# Patient Record
Sex: Female | Born: 1960 | Race: White | Hispanic: No | Marital: Single | Smoking: Never smoker
Health system: Southern US, Community
[De-identification: ages and names within clinical notes are randomized; demographics above are authoritative.]

## PROBLEM LIST (undated history)

## (undated) DIAGNOSIS — Z923 Personal history of irradiation: Secondary | ICD-10-CM

## (undated) DIAGNOSIS — Z809 Family history of malignant neoplasm, unspecified: Secondary | ICD-10-CM

## (undated) DIAGNOSIS — Z87442 Personal history of urinary calculi: Secondary | ICD-10-CM

## (undated) DIAGNOSIS — T7840XA Allergy, unspecified, initial encounter: Secondary | ICD-10-CM

## (undated) DIAGNOSIS — C801 Malignant (primary) neoplasm, unspecified: Secondary | ICD-10-CM

## (undated) DIAGNOSIS — J302 Other seasonal allergic rhinitis: Secondary | ICD-10-CM

## (undated) DIAGNOSIS — J42 Unspecified chronic bronchitis: Secondary | ICD-10-CM

## (undated) DIAGNOSIS — N63 Unspecified lump in unspecified breast: Secondary | ICD-10-CM

## (undated) DIAGNOSIS — Z1379 Encounter for other screening for genetic and chromosomal anomalies: Principal | ICD-10-CM

## (undated) DIAGNOSIS — N390 Urinary tract infection, site not specified: Secondary | ICD-10-CM

## (undated) DIAGNOSIS — Z803 Family history of malignant neoplasm of breast: Secondary | ICD-10-CM

## (undated) DIAGNOSIS — K5790 Diverticulosis of intestine, part unspecified, without perforation or abscess without bleeding: Secondary | ICD-10-CM

## (undated) DIAGNOSIS — K219 Gastro-esophageal reflux disease without esophagitis: Secondary | ICD-10-CM

## (undated) DIAGNOSIS — J45909 Unspecified asthma, uncomplicated: Secondary | ICD-10-CM

## (undated) DIAGNOSIS — B029 Zoster without complications: Secondary | ICD-10-CM

## (undated) DIAGNOSIS — S92919A Unspecified fracture of unspecified toe(s), initial encounter for closed fracture: Secondary | ICD-10-CM

## (undated) HISTORY — PX: COLONOSCOPY: SHX174

## (undated) HISTORY — DX: Unspecified lump in unspecified breast: N63.0

## (undated) HISTORY — DX: Gastro-esophageal reflux disease without esophagitis: K21.9

## (undated) HISTORY — DX: Zoster without complications: B02.9

## (undated) HISTORY — DX: Unspecified fracture of unspecified toe(s), initial encounter for closed fracture: S92.919A

## (undated) HISTORY — DX: Family history of malignant neoplasm, unspecified: Z80.9

## (undated) HISTORY — DX: Unspecified chronic bronchitis: J42

## (undated) HISTORY — DX: Urinary tract infection, site not specified: N39.0

## (undated) HISTORY — PX: OTHER SURGICAL HISTORY: SHX169

## (undated) HISTORY — DX: Diverticulosis of intestine, part unspecified, without perforation or abscess without bleeding: K57.90

## (undated) HISTORY — DX: Unspecified asthma, uncomplicated: J45.909

## (undated) HISTORY — DX: Encounter for other screening for genetic and chromosomal anomalies: Z13.79

## (undated) HISTORY — DX: Allergy, unspecified, initial encounter: T78.40XA

## (undated) HISTORY — DX: Family history of malignant neoplasm of breast: Z80.3

---

## 1989-07-05 DIAGNOSIS — S92919A Unspecified fracture of unspecified toe(s), initial encounter for closed fracture: Secondary | ICD-10-CM

## 1989-07-05 HISTORY — DX: Unspecified fracture of unspecified toe(s), initial encounter for closed fracture: S92.919A

## 2002-01-02 DIAGNOSIS — N63 Unspecified lump in unspecified breast: Secondary | ICD-10-CM

## 2002-01-02 HISTORY — DX: Unspecified lump in unspecified breast: N63.0

## 2002-01-12 ENCOUNTER — Encounter: Payer: Self-pay | Admitting: Internal Medicine

## 2002-01-12 ENCOUNTER — Encounter: Admission: RE | Admit: 2002-01-12 | Discharge: 2002-01-12 | Payer: Self-pay | Admitting: Internal Medicine

## 2004-01-17 ENCOUNTER — Encounter: Admission: RE | Admit: 2004-01-17 | Discharge: 2004-01-17 | Payer: Self-pay | Admitting: Internal Medicine

## 2005-01-17 ENCOUNTER — Encounter: Admission: RE | Admit: 2005-01-17 | Discharge: 2005-01-17 | Payer: Self-pay | Admitting: Internal Medicine

## 2005-03-12 ENCOUNTER — Ambulatory Visit (HOSPITAL_COMMUNITY): Admission: RE | Admit: 2005-03-12 | Discharge: 2005-03-12 | Payer: Self-pay | Admitting: Internal Medicine

## 2006-01-20 ENCOUNTER — Encounter: Admission: RE | Admit: 2006-01-20 | Discharge: 2006-01-20 | Payer: Self-pay | Admitting: Internal Medicine

## 2006-09-11 ENCOUNTER — Encounter: Admission: RE | Admit: 2006-09-11 | Discharge: 2006-09-11 | Payer: Self-pay | Admitting: Internal Medicine

## 2006-10-04 DIAGNOSIS — B029 Zoster without complications: Secondary | ICD-10-CM

## 2006-10-04 HISTORY — DX: Zoster without complications: B02.9

## 2007-01-23 ENCOUNTER — Encounter: Admission: RE | Admit: 2007-01-23 | Discharge: 2007-01-23 | Payer: Self-pay | Admitting: Internal Medicine

## 2007-04-14 ENCOUNTER — Ambulatory Visit: Payer: Self-pay | Admitting: Internal Medicine

## 2007-04-23 ENCOUNTER — Ambulatory Visit: Payer: Self-pay | Admitting: Internal Medicine

## 2008-01-28 ENCOUNTER — Encounter: Admission: RE | Admit: 2008-01-28 | Discharge: 2008-01-28 | Payer: Self-pay | Admitting: Internal Medicine

## 2009-01-31 ENCOUNTER — Encounter: Admission: RE | Admit: 2009-01-31 | Discharge: 2009-01-31 | Payer: Self-pay | Admitting: Internal Medicine

## 2010-02-06 ENCOUNTER — Encounter: Admission: RE | Admit: 2010-02-06 | Discharge: 2010-02-06 | Payer: Self-pay | Admitting: Internal Medicine

## 2011-01-09 ENCOUNTER — Other Ambulatory Visit: Payer: Self-pay | Admitting: Internal Medicine

## 2011-01-09 DIAGNOSIS — Z1231 Encounter for screening mammogram for malignant neoplasm of breast: Secondary | ICD-10-CM

## 2011-02-12 ENCOUNTER — Ambulatory Visit
Admission: RE | Admit: 2011-02-12 | Discharge: 2011-02-12 | Disposition: A | Discharge status: Home / Self Care | Payer: BLUE CROSS/BLUE SHIELD | Source: Ambulatory Visit | Attending: Internal Medicine | Admitting: Internal Medicine

## 2011-02-12 DIAGNOSIS — Z1231 Encounter for screening mammogram for malignant neoplasm of breast: Secondary | ICD-10-CM

## 2012-01-16 ENCOUNTER — Other Ambulatory Visit: Payer: Self-pay | Admitting: Internal Medicine

## 2012-01-16 DIAGNOSIS — Z1231 Encounter for screening mammogram for malignant neoplasm of breast: Secondary | ICD-10-CM

## 2012-02-19 ENCOUNTER — Ambulatory Visit
Admission: RE | Admit: 2012-02-19 | Discharge: 2012-02-19 | Disposition: A | Discharge status: Home / Self Care | Payer: BC Managed Care – PPO | Source: Ambulatory Visit | Attending: Internal Medicine | Admitting: Internal Medicine

## 2012-02-19 DIAGNOSIS — Z1231 Encounter for screening mammogram for malignant neoplasm of breast: Secondary | ICD-10-CM

## 2012-04-29 ENCOUNTER — Encounter: Payer: Self-pay | Admitting: Internal Medicine

## 2012-05-22 ENCOUNTER — Encounter: Payer: Self-pay | Admitting: *Deleted

## 2012-05-25 ENCOUNTER — Encounter: Payer: Self-pay | Admitting: Internal Medicine

## 2012-05-25 ENCOUNTER — Ambulatory Visit (INDEPENDENT_AMBULATORY_CARE_PROVIDER_SITE_OTHER): Payer: BC Managed Care – PPO | Admitting: Internal Medicine

## 2012-05-25 VITALS — BP 100/70 | HR 84 | Ht 62.0 in | Wt 131.2 lb

## 2012-05-25 DIAGNOSIS — R195 Other fecal abnormalities: Secondary | ICD-10-CM

## 2012-05-25 DIAGNOSIS — J45909 Unspecified asthma, uncomplicated: Secondary | ICD-10-CM | POA: Insufficient documentation

## 2012-05-25 DIAGNOSIS — J42 Unspecified chronic bronchitis: Secondary | ICD-10-CM | POA: Insufficient documentation

## 2012-05-25 MED ORDER — MOVIPREP 100 G PO SOLR: ORAL | Status: DC

## 2012-05-25 NOTE — Progress Notes (Signed)
  Subjective:  Referred by: Gaspar Garbe, MD   Patient ID: Mallory Barnes, female    DOB: 1961-07-12, 51 y.o.   MRN: [id number]  HPI This is a very pleasant 51 year old single white woman who was found to have an immune based fecal Hemoccult positive stool on recent routine physical with labs. She is not anemic. She has no active symptoms and has not seen blood. She takes proton next because of cough issues which  improved but eventually resolved or significantly improved using Advair on a regular basis.  Medications, allergies, past medical history, past surgical history, family history and social history are reviewed and updated in the EMR.  Review of Systems This is positive for  allergies and night sweats. Recent UTI troubles.    Objective:   Physical Exam General:  NAD Eyes:   anicteric Lungs:  clear Heart:  S1S2 no rubs, murmurs or gallops Abdomen:  soft and nontender, BS+ Ext:   no edema    Data Reviewed:   Hemoglobin 12.5 white blood cell count 6.3 on 04/10/2012. No gross 112 BUN 25 otherwise normal complete metabolic panel. She had pyuria and hematuria on urinalysis that day.       Assessment & Plan:   1. Heme positive stool (iFOBT)    will schedule for colonoscopy to look for cause of heme positive stool. Previous colonoscopy June 2008 revealed diverticulosis. She does have a grandparent and a maternal uncle with colon cancer. This may not increase her risk, timing of next routine colonoscopy to be determined pending the results of the upcoming procedure.The risks and benefits as well as alternatives of endoscopic procedure(s) have been discussed and reviewed. All questions answered. The patient agrees to proceed.  Was advised to bring her albuterol inhaler for her procedure visit.   I appreciate the opportunity to care for this patient.  CC: Gaspar Garbe, MD

## 2012-05-25 NOTE — Patient Instructions (Addendum)
You have been scheduled for a colonoscopy with propofol. Please follow written instructions given to you at your visit today.  Please pick up your prep kit at the pharmacy within the next 1-3 days. If you use inhalers (even only as needed), please bring them with you on the day of your procedure.   Thank you for choosing me and [facility]. 

## 2012-06-11 ENCOUNTER — Encounter: Payer: BC Managed Care – PPO | Admitting: Internal Medicine

## 2012-07-08 ENCOUNTER — Ambulatory Visit (AMBULATORY_SURGERY_CENTER): Payer: BC Managed Care – PPO | Admitting: Internal Medicine

## 2012-07-08 ENCOUNTER — Encounter: Payer: Self-pay | Admitting: Internal Medicine

## 2012-07-08 VITALS — BP 107/58 | HR 92 | Temp 98.0°F | Resp 20 | Ht 62.0 in | Wt 131.0 lb

## 2012-07-08 DIAGNOSIS — K5730 Diverticulosis of large intestine without perforation or abscess without bleeding: Secondary | ICD-10-CM

## 2012-07-08 DIAGNOSIS — R195 Other fecal abnormalities: Secondary | ICD-10-CM

## 2012-07-08 MED ORDER — SODIUM CHLORIDE 0.9 % IV SOLN: 500.0000 mL | INTRAVENOUS | Status: DC

## 2012-07-08 NOTE — Patient Instructions (Addendum)
Slight diverticulosis was found. No polyps or cancer. The blood in the stool may have come from ano-rectal irritation that could have been present.  I do not think the family history of colon cancer (grandparent and aunt) increases your risk of colon cancer based upon review of current expert opinion.  I recommend a routine repeat colonoscopy in 10 years and do not think routine testing of the stool for blood is needed. If that was done I would wait until 5 years has passed 04-08-2018).  Thank you for choosing me and [facility].  Iva Boop, MD, FACG   YOU HAD AN ENDOSCOPIC PROCEDURE TODAY AT THE [facility]: Refer to the procedure report that was given to you for any specific questions about what was found during the examination.  If the procedure report does not answer your questions, please call your gastroenterologist to clarify.  If you requested that your care partner not be given the details of your procedure findings, then the procedure report has been included in a sealed envelope for you to review at your convenience later.  YOU SHOULD EXPECT: Some feelings of bloating in the abdomen. Passage of more gas than usual.  Walking can help get rid of the air that was put into your GI tract during the procedure and reduce the bloating. If you had a lower endoscopy (such as a colonoscopy or flexible sigmoidoscopy) you may notice spotting of blood in your stool or on the toilet paper. If you underwent a bowel prep for your procedure, then you may not have a normal bowel movement for a few days.  DIET: Your first meal following the procedure should be a light meal and then it is ok to progress to your normal diet.  A half-sandwich or bowl of soup is an example of a good first meal.  Heavy or fried foods are harder to digest and may make you feel nauseous or bloated.  Likewise meals heavy in dairy and vegetables can cause extra gas to form and this can also increase the  bloating.  Drink plenty of fluids but you should avoid alcoholic beverages for 24 hours.  ACTIVITY: Your care partner should take you home directly after the procedure.  You should plan to take it easy, moving slowly for the rest of the day.  You can resume normal activity the day after the procedure however you should NOT DRIVE or use heavy machinery for 24 hours (because of the sedation medicines used during the test).    SYMPTOMS TO REPORT IMMEDIATELY: A gastroenterologist can be reached at any hour.  During normal business hours, 8:30 AM to 5:00 PM Monday through Friday, call [phone].  After hours and on weekends, please call the GI answering service at [phone] who will take a message and have the physician on call contact you.   Following lower endoscopy (colonoscopy or flexible sigmoidoscopy):  Excessive amounts of blood in the stool  Significant tenderness or worsening of abdominal pains  Swelling of the abdomen that is new, acute  Fever of 100F or higher  Following upper endoscopy (EGD)  Vomiting of blood or coffee ground material  New chest pain or pain under the shoulder blades  Painful or persistently difficult swallowing  New shortness of breath  Fever of 100F or higher  Black, tarry-looking stools  FOLLOW UP: If any biopsies were taken you will be contacted by phone or by letter within the next 1-3 weeks.  Call your gastroenterologist if  you have not heard about the biopsies in 3 weeks.  Our staff will call the home number listed on your records the next business day following your procedure to check on you and address any questions or concerns that you may have at that time regarding the information given to you following your procedure. This is a courtesy call and so if there is no answer at the home number and we have not heard from you through the emergency physician on call, we will assume that you have returned to your regular daily activities without  incident.  SIGNATURES/CONFIDENTIALITY: You and/or your care partner have signed paperwork which will be entered into your electronic medical record.  These signatures attest to the fact that that the information above on your After Visit Summary has been reviewed and is understood.  Full responsibility of the confidentiality of this discharge information lies with you and/or your care-partner.   Diverticulosis and high fiber diet information given.

## 2012-07-08 NOTE — Progress Notes (Signed)
Patient did not experience any of the following events: a burn prior to discharge; a fall within the facility; wrong site/side/patient/procedure/implant event; or a hospital transfer or hospital admission upon discharge from the facility. (G8907) Patient did not have preoperative order for IV antibiotic SSI prophylaxis. (G8918)  

## 2012-07-08 NOTE — Op Note (Signed)
[facility] [address] [place], [address]   COLONOSCOPY PROCEDURE REPORT  PATIENT: Mallory Barnes, Mallory Barnes  MR#: [id number] BIRTHDATE: 06/13/1961 , 50  yrs. old GENDER: Female ENDOSCOPIST: Iva Boop, MD, [facility] REFERRED NW:GNFAOZH, Richard PROCEDURE DATE:  07/08/2012 PROCEDURE:   Colonoscopy, diagnostic ASA CLASS:   Class II INDICATIONS:heme-positive stool.   iFOBT+ MEDICATIONS: propofol (Diprivan) 200mg  IV, MAC sedation, administered by CRNA, and These medications were titrated to patient response per physician's verbal order  DESCRIPTION OF PROCEDURE:   After the risks benefits and alternatives of the procedure were thoroughly explained, informed consent was obtained.  A digital rectal exam revealed no abnormalities of the rectum.   The LB [id number]  endoscope was introduced through the anus and advanced to the cecum, which was identified by both the appendix and ileocecal valve. No adverse events experienced.   The quality of the prep was Moviprep excellent  The instrument was then slowly withdrawn as the colon was fully examined.      COLON FINDINGS: Mild diverticulosis was noted in the sigmoid colon. The colon mucosa was otherwise normal.  Retroflexed views revealed no abnormalities. The time to cecum=2 minutes 30 seconds. Withdrawal time=8 minutes 05 seconds.  The scope was withdrawn and the procedure completed. COMPLICATIONS: There were no complications.  ENDOSCOPIC IMPRESSION: 1.   Mild diverticulosis was noted in the sigmoid colon 2.   The colon mucosa was otherwise normal no cause of heme + stool seen today  RECOMMENDATIONS: 1) repeat Colonscopy in 10 years - routine screening - has second degree-relatives with colon cancer but that does not elevate risk 2) do not recommend annual hemoocults - if done wait until 2019 to start   eSigned:  Iva Boop, MD, [facility] 07/08/2012 3:01 PM   cc: Guerry Bruin, MD and The  Patient

## 2012-07-09 ENCOUNTER — Telehealth: Payer: Self-pay | Admitting: *Deleted

## 2012-07-09 NOTE — Telephone Encounter (Signed)
  Follow up Call-  Call back number 07/08/2012  Post procedure Call Back phone  # [phone]  Permission to leave phone message Yes     Patient questions:  Do you have a fever, pain , or abdominal swelling? no Pain Score  0 *  Have you tolerated food without any problems? yes  Have you been able to return to your normal activities? yes  Do you have any questions about your discharge instructions: Diet   no Medications  no Follow up visit  no  Do you have questions or concerns about your Care? no  Actions: * If pain score is 4 or above: No action needed, pain <4.

## 2015-01-12 ENCOUNTER — Other Ambulatory Visit: Payer: Self-pay | Admitting: Internal Medicine

## 2015-01-12 DIAGNOSIS — Z1231 Encounter for screening mammogram for malignant neoplasm of breast: Secondary | ICD-10-CM

## 2015-02-28 ENCOUNTER — Ambulatory Visit
Admission: RE | Admit: 2015-02-28 | Discharge: 2015-02-28 | Disposition: A | Discharge status: Home / Self Care | Payer: 59 | Source: Ambulatory Visit | Attending: Internal Medicine | Admitting: Internal Medicine

## 2015-02-28 DIAGNOSIS — Z1231 Encounter for screening mammogram for malignant neoplasm of breast: Secondary | ICD-10-CM

## 2016-02-10 DIAGNOSIS — W1809XA Striking against other object with subsequent fall, initial encounter: Secondary | ICD-10-CM | POA: Diagnosis not present

## 2016-02-10 DIAGNOSIS — S81811A Laceration without foreign body, right lower leg, initial encounter: Secondary | ICD-10-CM | POA: Diagnosis not present

## 2016-02-10 DIAGNOSIS — S81812A Laceration without foreign body, left lower leg, initial encounter: Secondary | ICD-10-CM | POA: Diagnosis not present

## 2016-02-11 DIAGNOSIS — S81811D Laceration without foreign body, right lower leg, subsequent encounter: Secondary | ICD-10-CM | POA: Diagnosis not present

## 2016-02-11 DIAGNOSIS — Z79899 Other long term (current) drug therapy: Secondary | ICD-10-CM | POA: Diagnosis not present

## 2016-02-11 DIAGNOSIS — X58XXXD Exposure to other specified factors, subsequent encounter: Secondary | ICD-10-CM | POA: Diagnosis not present

## 2016-02-11 DIAGNOSIS — Z880 Allergy status to penicillin: Secondary | ICD-10-CM | POA: Diagnosis not present

## 2016-02-12 DIAGNOSIS — S81812A Laceration without foreign body, left lower leg, initial encounter: Secondary | ICD-10-CM | POA: Diagnosis not present

## 2016-02-12 DIAGNOSIS — Z6826 Body mass index (BMI) 26.0-26.9, adult: Secondary | ICD-10-CM | POA: Diagnosis not present

## 2016-02-12 DIAGNOSIS — Z5189 Encounter for other specified aftercare: Secondary | ICD-10-CM | POA: Diagnosis not present

## 2016-02-19 DIAGNOSIS — Z0000 Encounter for general adult medical examination without abnormal findings: Secondary | ICD-10-CM | POA: Diagnosis not present

## 2016-02-19 DIAGNOSIS — N390 Urinary tract infection, site not specified: Secondary | ICD-10-CM | POA: Diagnosis not present

## 2016-02-19 DIAGNOSIS — R7301 Impaired fasting glucose: Secondary | ICD-10-CM | POA: Diagnosis not present

## 2016-02-19 DIAGNOSIS — R8299 Other abnormal findings in urine: Secondary | ICD-10-CM | POA: Diagnosis not present

## 2016-02-23 DIAGNOSIS — Z6827 Body mass index (BMI) 27.0-27.9, adult: Secondary | ICD-10-CM | POA: Diagnosis not present

## 2016-02-23 DIAGNOSIS — S81812D Laceration without foreign body, left lower leg, subsequent encounter: Secondary | ICD-10-CM | POA: Diagnosis not present

## 2016-02-23 DIAGNOSIS — Z5189 Encounter for other specified aftercare: Secondary | ICD-10-CM | POA: Diagnosis not present

## 2016-02-26 DIAGNOSIS — R7301 Impaired fasting glucose: Secondary | ICD-10-CM | POA: Diagnosis not present

## 2016-02-26 DIAGNOSIS — N390 Urinary tract infection, site not specified: Secondary | ICD-10-CM | POA: Diagnosis not present

## 2016-02-26 DIAGNOSIS — Z0000 Encounter for general adult medical examination without abnormal findings: Secondary | ICD-10-CM | POA: Diagnosis not present

## 2016-02-26 DIAGNOSIS — Z124 Encounter for screening for malignant neoplasm of cervix: Secondary | ICD-10-CM | POA: Diagnosis not present

## 2016-02-26 DIAGNOSIS — Z6827 Body mass index (BMI) 27.0-27.9, adult: Secondary | ICD-10-CM | POA: Diagnosis not present

## 2016-02-26 DIAGNOSIS — K219 Gastro-esophageal reflux disease without esophagitis: Secondary | ICD-10-CM | POA: Diagnosis not present

## 2016-02-26 DIAGNOSIS — Z1389 Encounter for screening for other disorder: Secondary | ICD-10-CM | POA: Diagnosis not present

## 2017-02-18 ENCOUNTER — Other Ambulatory Visit: Payer: Self-pay | Admitting: Internal Medicine

## 2017-02-18 DIAGNOSIS — Z1231 Encounter for screening mammogram for malignant neoplasm of breast: Secondary | ICD-10-CM

## 2017-02-19 DIAGNOSIS — N390 Urinary tract infection, site not specified: Secondary | ICD-10-CM | POA: Diagnosis not present

## 2017-02-19 DIAGNOSIS — Z0000 Encounter for general adult medical examination without abnormal findings: Secondary | ICD-10-CM | POA: Diagnosis not present

## 2017-02-19 DIAGNOSIS — R7301 Impaired fasting glucose: Secondary | ICD-10-CM | POA: Diagnosis not present

## 2017-02-26 DIAGNOSIS — R7301 Impaired fasting glucose: Secondary | ICD-10-CM | POA: Diagnosis not present

## 2017-02-26 DIAGNOSIS — Z0000 Encounter for general adult medical examination without abnormal findings: Secondary | ICD-10-CM | POA: Diagnosis not present

## 2017-02-26 DIAGNOSIS — K219 Gastro-esophageal reflux disease without esophagitis: Secondary | ICD-10-CM | POA: Diagnosis not present

## 2017-02-26 DIAGNOSIS — J42 Unspecified chronic bronchitis: Secondary | ICD-10-CM | POA: Diagnosis not present

## 2017-02-26 DIAGNOSIS — N390 Urinary tract infection, site not specified: Secondary | ICD-10-CM | POA: Diagnosis not present

## 2017-02-26 DIAGNOSIS — Z1389 Encounter for screening for other disorder: Secondary | ICD-10-CM | POA: Diagnosis not present

## 2017-03-10 ENCOUNTER — Ambulatory Visit
Admission: RE | Admit: 2017-03-10 | Discharge: 2017-03-10 | Disposition: A | Discharge status: Home / Self Care | Payer: BLUE CROSS/BLUE SHIELD | Source: Ambulatory Visit | Attending: Internal Medicine | Admitting: Internal Medicine

## 2017-03-10 DIAGNOSIS — Z1231 Encounter for screening mammogram for malignant neoplasm of breast: Secondary | ICD-10-CM | POA: Diagnosis not present

## 2017-04-28 DIAGNOSIS — H524 Presbyopia: Secondary | ICD-10-CM | POA: Diagnosis not present

## 2017-04-28 DIAGNOSIS — H2513 Age-related nuclear cataract, bilateral: Secondary | ICD-10-CM | POA: Diagnosis not present

## 2017-04-28 DIAGNOSIS — H52203 Unspecified astigmatism, bilateral: Secondary | ICD-10-CM | POA: Diagnosis not present

## 2018-02-18 ENCOUNTER — Other Ambulatory Visit: Payer: Self-pay | Admitting: Internal Medicine

## 2018-02-18 DIAGNOSIS — Z1231 Encounter for screening mammogram for malignant neoplasm of breast: Secondary | ICD-10-CM

## 2018-02-23 DIAGNOSIS — R7301 Impaired fasting glucose: Secondary | ICD-10-CM | POA: Diagnosis not present

## 2018-02-23 DIAGNOSIS — Z0000 Encounter for general adult medical examination without abnormal findings: Secondary | ICD-10-CM | POA: Diagnosis not present

## 2018-03-02 DIAGNOSIS — Z124 Encounter for screening for malignant neoplasm of cervix: Secondary | ICD-10-CM | POA: Diagnosis not present

## 2018-03-02 DIAGNOSIS — N390 Urinary tract infection, site not specified: Secondary | ICD-10-CM | POA: Diagnosis not present

## 2018-03-02 DIAGNOSIS — R7301 Impaired fasting glucose: Secondary | ICD-10-CM | POA: Diagnosis not present

## 2018-03-02 DIAGNOSIS — J42 Unspecified chronic bronchitis: Secondary | ICD-10-CM | POA: Diagnosis not present

## 2018-03-02 DIAGNOSIS — Z0000 Encounter for general adult medical examination without abnormal findings: Secondary | ICD-10-CM | POA: Diagnosis not present

## 2018-03-02 DIAGNOSIS — K219 Gastro-esophageal reflux disease without esophagitis: Secondary | ICD-10-CM | POA: Diagnosis not present

## 2018-03-02 DIAGNOSIS — Z1389 Encounter for screening for other disorder: Secondary | ICD-10-CM | POA: Diagnosis not present

## 2018-03-10 DIAGNOSIS — Z23 Encounter for immunization: Secondary | ICD-10-CM | POA: Diagnosis not present

## 2018-03-10 DIAGNOSIS — W540XXA Bitten by dog, initial encounter: Secondary | ICD-10-CM | POA: Diagnosis not present

## 2018-03-10 DIAGNOSIS — S80872A Other superficial bite, left lower leg, initial encounter: Secondary | ICD-10-CM | POA: Diagnosis not present

## 2018-03-13 ENCOUNTER — Ambulatory Visit
Admission: RE | Admit: 2018-03-13 | Discharge: 2018-03-13 | Disposition: A | Discharge status: Home / Self Care | Payer: BLUE CROSS/BLUE SHIELD | Source: Ambulatory Visit | Attending: Internal Medicine | Admitting: Internal Medicine

## 2018-03-13 DIAGNOSIS — Z1231 Encounter for screening mammogram for malignant neoplasm of breast: Secondary | ICD-10-CM

## 2018-03-16 ENCOUNTER — Other Ambulatory Visit: Payer: Self-pay | Admitting: Internal Medicine

## 2018-03-16 DIAGNOSIS — R928 Other abnormal and inconclusive findings on diagnostic imaging of breast: Secondary | ICD-10-CM

## 2018-03-23 ENCOUNTER — Ambulatory Visit
Admission: RE | Admit: 2018-03-23 | Discharge: 2018-03-23 | Disposition: A | Discharge status: Home / Self Care | Payer: BLUE CROSS/BLUE SHIELD | Source: Ambulatory Visit | Attending: Internal Medicine | Admitting: Internal Medicine

## 2018-03-23 ENCOUNTER — Other Ambulatory Visit: Payer: Self-pay | Admitting: Internal Medicine

## 2018-03-23 DIAGNOSIS — R928 Other abnormal and inconclusive findings on diagnostic imaging of breast: Secondary | ICD-10-CM

## 2018-03-23 DIAGNOSIS — R921 Mammographic calcification found on diagnostic imaging of breast: Secondary | ICD-10-CM

## 2018-03-27 ENCOUNTER — Ambulatory Visit
Admission: RE | Admit: 2018-03-27 | Discharge: 2018-03-27 | Disposition: A | Discharge status: Home / Self Care | Payer: BLUE CROSS/BLUE SHIELD | Source: Ambulatory Visit | Attending: Internal Medicine | Admitting: Internal Medicine

## 2018-03-27 ENCOUNTER — Other Ambulatory Visit: Payer: Self-pay | Admitting: Internal Medicine

## 2018-03-27 DIAGNOSIS — R921 Mammographic calcification found on diagnostic imaging of breast: Secondary | ICD-10-CM

## 2018-03-27 DIAGNOSIS — D0511 Intraductal carcinoma in situ of right breast: Secondary | ICD-10-CM | POA: Diagnosis not present

## 2018-04-01 ENCOUNTER — Encounter: Payer: Self-pay | Admitting: *Deleted

## 2018-04-01 DIAGNOSIS — D0511 Intraductal carcinoma in situ of right breast: Secondary | ICD-10-CM

## 2018-04-08 ENCOUNTER — Ambulatory Visit: Payer: Self-pay | Admitting: Surgery

## 2018-04-08 ENCOUNTER — Ambulatory Visit
Admission: RE | Admit: 2018-04-08 | Discharge: 2018-04-08 | Disposition: A | Discharge status: Home / Self Care | Payer: BLUE CROSS/BLUE SHIELD | Source: Ambulatory Visit | Attending: Radiation Oncology | Admitting: Radiation Oncology

## 2018-04-08 ENCOUNTER — Ambulatory Visit: Payer: BLUE CROSS/BLUE SHIELD | Admitting: Physical Therapy

## 2018-04-08 ENCOUNTER — Encounter: Payer: Self-pay | Admitting: Hematology and Oncology

## 2018-04-08 ENCOUNTER — Inpatient Hospital Stay: Payer: BLUE CROSS/BLUE SHIELD | Attending: Hematology and Oncology | Admitting: Hematology and Oncology

## 2018-04-08 ENCOUNTER — Inpatient Hospital Stay: Payer: BLUE CROSS/BLUE SHIELD

## 2018-04-08 ENCOUNTER — Encounter: Payer: Self-pay | Admitting: Radiation Oncology

## 2018-04-08 DIAGNOSIS — Z8041 Family history of malignant neoplasm of ovary: Secondary | ICD-10-CM | POA: Insufficient documentation

## 2018-04-08 DIAGNOSIS — Z8042 Family history of malignant neoplasm of prostate: Secondary | ICD-10-CM | POA: Insufficient documentation

## 2018-04-08 DIAGNOSIS — Z800 Family history of malignant neoplasm of digestive organs: Secondary | ICD-10-CM | POA: Diagnosis not present

## 2018-04-08 DIAGNOSIS — Z807 Family history of other malignant neoplasms of lymphoid, hematopoietic and related tissues: Secondary | ICD-10-CM | POA: Diagnosis not present

## 2018-04-08 DIAGNOSIS — K219 Gastro-esophageal reflux disease without esophagitis: Secondary | ICD-10-CM | POA: Insufficient documentation

## 2018-04-08 DIAGNOSIS — Z803 Family history of malignant neoplasm of breast: Secondary | ICD-10-CM | POA: Insufficient documentation

## 2018-04-08 DIAGNOSIS — D0511 Intraductal carcinoma in situ of right breast: Secondary | ICD-10-CM

## 2018-04-08 DIAGNOSIS — Z87440 Personal history of urinary (tract) infections: Secondary | ICD-10-CM | POA: Insufficient documentation

## 2018-04-08 DIAGNOSIS — Z79899 Other long term (current) drug therapy: Secondary | ICD-10-CM | POA: Insufficient documentation

## 2018-04-08 DIAGNOSIS — Z171 Estrogen receptor negative status [ER-]: Secondary | ICD-10-CM | POA: Diagnosis not present

## 2018-04-08 LAB — CBC WITH DIFFERENTIAL (CANCER CENTER ONLY)
BASOS ABS: 0 10*3/uL (ref 0.0–0.1)
Basophils Relative: 1 %
Eosinophils Absolute: 0.1 10*3/uL (ref 0.0–0.5)
Eosinophils Relative: 2 %
HCT: 39.9 % (ref 34.8–46.6)
Hemoglobin: 13.3 g/dL (ref 11.6–15.9)
LYMPHS ABS: 1.5 10*3/uL (ref 0.9–3.3)
LYMPHS PCT: 30 %
MCH: 29.8 pg (ref 25.1–34.0)
MCHC: 33.4 g/dL (ref 31.5–36.0)
MCV: 89.3 fL (ref 79.5–101.0)
Monocytes Absolute: 0.3 10*3/uL (ref 0.1–0.9)
Monocytes Relative: 7 %
NEUTROS PCT: 60 %
Neutro Abs: 3 10*3/uL (ref 1.5–6.5)
Platelet Count: 254 10*3/uL (ref 145–400)
RBC: 4.47 MIL/uL (ref 3.70–5.45)
RDW: 13 % (ref 11.2–14.5)
WBC: 4.9 10*3/uL (ref 3.9–10.3)

## 2018-04-08 LAB — CMP (CANCER CENTER ONLY)
ALK PHOS: 86 U/L (ref 40–150)
ALT: 17 U/L (ref 0–55)
AST: 24 U/L (ref 5–34)
Albumin: 4.4 g/dL (ref 3.5–5.0)
Anion gap: 11 (ref 3–11)
BUN: 26 mg/dL (ref 7–26)
CALCIUM: 9.9 mg/dL (ref 8.4–10.4)
CO2: 26 mmol/L (ref 22–29)
Chloride: 104 mmol/L (ref 98–109)
Creatinine: 1.41 mg/dL — ABNORMAL HIGH (ref 0.60–1.10)
GFR, EST AFRICAN AMERICAN: 48 mL/min — AB (ref >60)
GFR, EST NON AFRICAN AMERICAN: 41 mL/min — AB (ref >60)
Glucose, Bld: 112 mg/dL (ref 70–140)
Potassium: 4.2 mmol/L (ref 3.5–5.1)
SODIUM: 141 mmol/L (ref 136–145)
Total Bilirubin: 0.5 mg/dL (ref 0.2–1.2)
Total Protein: 7 g/dL (ref 6.4–8.3)

## 2018-04-08 NOTE — Progress Notes (Signed)
Nutrition Assessment  Reason for Assessment:  Pt seen in Breast Clinic  ASSESSMENT:   57 year old female with new diagnosis of breast cancer.  Past medical history asthma, GERD.    Patient reports normal appetite but does not like a lot of vegetables.  Medications:  reviewed  Labs: reviewed  Anthropometrics:   Height: 62 inches Weight: 132 lb BMI: 24   NUTRITION DIAGNOSIS: Food and nutrition related knowledge deficit related to new diagnosis of breast cancer as evidenced by no prior need for nutrition related information.  INTERVENTION:   Discussed and provided packet of information regarding nutritional tips for breast cancer patients.  Questions answered.  Teachback method used.  Contact information provided and patient knows to contact me with questions/concerns.    MONITORING, EVALUATION, and GOAL: Pt will consume a healthy plant based diet to maintain lean body mass throughout treatment.   Arjan Strohm B. Zenia Resides, [place], [place] Registered Dietitian [phone] (pager)

## 2018-04-08 NOTE — Assessment & Plan Note (Signed)
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 It is considered a precancerous lesion. DCIS is classified as a 0. It is generally detected through mammograms as calcifications. We discussed the significance of grades and its impact on prognosis. We also discussed the importance of ER and PR receptors and their implications to adjuvant treatment options. Prognosis of DCIS dependence on grade, comedo necrosis. It is anticipated that if not treated, 20-30% of DCIS can develop into invasive breast cancer.  Recommendation: 1. Breast conserving surgery 2. Followed by adjuvant radiation therapy 3.  Genetics consultation because her sister had breast cancer at a young age   Return to clinic after surgery to discuss the final pathology report and come up with an adjuvant treatment plan.

## 2018-04-08 NOTE — Progress Notes (Signed)
Radiation Oncology         [phone] ________________________________  Initial Outpatient Consultation  Name: Mallory Barnes MRN: [id number]  Date: 04/08/2018  DOB: December 10, 1960  NW:GNFAOZH, Mallory Him, MD  Mallory Luna, MD   REFERRING PHYSICIAN: Erroll Luna, MD  DIAGNOSIS:    ICD-10-CM   1. Ductal carcinoma in situ (DCIS) of right breast D05.11    Stage 0 Right Breast UOQ Ductal Carcinoma In Situ, ER(-) / PR(-), High Grade  CHIEF COMPLAINT: Here to discuss management of right breast DCIS  HISTORY OF PRESENT ILLNESS::Mallory Barnes is a 57 y.o. female who presented with screening detected right breast calcifications on 03/13/18 mammography.  Diagnostic mammogram from 03/23/18 showed two adjacent indeterminate groups of coarse heterogeneous calcifications in the UOQ of the right breast.  The more anterior group was larger and contained more numerous calcifications, spanning up to 1.0 cm.  The posterior group was loosely scattered, spanning 0.9 cm.  Biopsy of both groups on 03/27/18 showed ductal carcinoma in situ with calcifications and necrosis with characteristics as described above in the diagnosis. The patient reports a family history including breast and ovarian cancer in her half-sister (father's side).  On review of systems, the patient is positive for headaches that she describes as throbbing. She wears glasses. She is positive for occasional slight tinnitus in her right ear. She is positive for anxiety since diagnosis. She is positive for occasional hot flashes.   Gynecologic History: -Age at first menstrual period: 13-14 -Are you still having periods?: No -Approximate date of last period: 2014 -Have you used hormone replacement?: No  Obstetric History: -How many children have you carried to term?: 0 -Are you pregnant now or trying to get pregnant?: No -Have you used birth control pills or hormone shots for contraception?: No  PREVIOUS RADIATION THERAPY: No  PAST  MEDICAL HISTORY:  has a past medical history of Allergy, Asthma, Breast lump (01/2002), Chronic bronchitis ([place]), Diverticulosis, Fractured toe (1990's), GERD (gastroesophageal reflux disease), Shingles (10/2006), and UTI (urinary tract infection).    PAST SURGICAL HISTORY: Past Surgical History:  Procedure Laterality Date  . COLONOSCOPY      FAMILY HISTORY: family history includes Alzheimer's disease in her mother; Breast cancer in her sister; Cancer in her father; Colon cancer in her maternal grandmother and maternal uncle; Diabetes in her father and sister; Emphysema in her sister; Heart attack in her mother; Hodgkin's lymphoma in her sister; Hypertension in her father; Ovarian cancer in her sister; Pneumonia in her mother; Prostate cancer in her father.  SOCIAL HISTORY:  reports that she has never smoked. She has never used smokeless tobacco. She reports that she drinks alcohol. She reports that she does not use drugs. 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  ALLERGIES: Doxycycline; Ketek [telithromycin]; and Penicillins  MEDICATIONS:  Current Outpatient Medications  Medication Sig Dispense Refill  . albuterol (PROVENTIL HFA;VENTOLIN HFA) 108 (90 BASE) MCG/ACT inhaler Inhale 2 puffs into the lungs. 2 puffs pre exercise prn    . BREO ELLIPTA 100-25 MCG/INH AEPB   11  . diphenhydrAMINE (BENADRYL) 25 MG tablet Take 25 mg by mouth every 6 (six) hours as needed.    Marland Kitchen GLUCOSAMINE-CHONDROITIN-MSM PO Take by mouth 2 (two) times daily.    Marland Kitchen ibuprofen (ADVIL,MOTRIN) 200 MG tablet Take 200 mg by mouth. 1 tab twice daily    . milk thistle 175 MG tablet Take 175 mg by mouth daily. Per patient, 1000 mg daily    . montelukast (SINGULAIR) 10 MG  tablet   2  . MULTIPLE VITAMIN PO Take by mouth.    Marland Kitchen OVER THE COUNTER MEDICATION Blach Cohosh 540 mg daily.    Marland Kitchen OVER THE COUNTER MEDICATION Afrin pump mist only as needed for nasal congestion    . pantoprazole (PROTONIX) 40 MG tablet Take 40 mg by mouth daily.      No current facility-administered medications for this encounter.     REVIEW OF SYSTEMS: A 10+ POINT REVIEW OF SYSTEMS WAS OBTAINED including neurology, dermatology, psychiatry, cardiac, respiratory, lymph, extremities, GI, GU, Musculoskeletal, constitutional, breasts, reproductive, HEENT.  All pertinent positives are noted in the HPI.  All others are negative.   PHYSICAL EXAM:  Vitals with BMI 04/08/2018  Height 5\' 2"   Weight 132 lbs 5 oz  BMI 42.35  Systolic 361  Diastolic 95  Pulse 88  Respirations 18   General: Alert and oriented, in no acute distress. HEENT: Head is normocephalic. Extraocular movements are intact. Oropharynx is clear. Neck: Neck is supple, no palpable cervical or supraclavicular lymphadenopathy. Heart: Regular in rate and rhythm with no murmurs, rubs, or gallops. Chest: Clear to auscultation bilaterally, with no rhonchi, wheezes, or rales. Abdomen: Soft, nontender, nondistended, with no rigidity or guarding. Extremities: No cyanosis or edema. Lymphatics: see Neck Exam Skin: No concerning lesions. Musculoskeletal: Symmetric strength and muscle tone throughout. Neurologic: Cranial nerves II through XII are grossly intact. No obvious focalities. Speech is fluent. Coordination is intact. Psychiatric: Judgment and insight are intact. Affect is appropriate. Breasts: Post-biopsy thickening in the right breast in the 8:00-9:00 position. Otherwise no palpable masses in the axillary regions or the left breast.    ECOG =0  0 - Asymptomatic (Fully active, able to carry on all predisease activities without restriction)  1 - Symptomatic but completely ambulatory (Restricted in physically strenuous activity but ambulatory and able to carry out work of a light or sedentary nature. For example, light housework, office work)  2 - Symptomatic, <50% in bed during the day (Ambulatory and capable of all self care but unable to carry out any work activities. Up and about more  than 50% of waking hours)  3 - Symptomatic, >50% in bed, but not bedbound (Capable of only limited self-care, confined to bed or chair 50% or more of waking hours)  4 - Bedbound (Completely disabled. Cannot carry on any self-care. Totally confined to bed or chair)  5 - Death   Eustace Pen MM, Creech RH, Tormey DC, et al. [id number]). "Toxicity and response criteria of the [facility]". [place] Oncol. 5 (6): 649-55   LABORATORY DATA:  Lab Results  Component Value Date   WBC 4.9 04/08/2018   HGB 13.3 04/08/2018   HCT 39.9 04/08/2018   MCV 89.3 04/08/2018   PLT 254 04/08/2018   CMP     Component Value Date/Time   NA 141 04/08/2018 1220   K 4.2 04/08/2018 1220   CL 104 04/08/2018 1220   CO2 26 04/08/2018 1220   GLUCOSE 112 04/08/2018 1220   BUN 26 04/08/2018 1220   CREATININE 1.41 (H) 04/08/2018 1220   CALCIUM 9.9 04/08/2018 1220   PROT 7.0 04/08/2018 1220   ALBUMIN 4.4 04/08/2018 1220   AST 24 04/08/2018 1220   ALT 17 04/08/2018 1220   ALKPHOS 86 04/08/2018 1220   BILITOT 0.5 04/08/2018 1220   GFRNONAA 41 (L) 04/08/2018 1220   GFRAA 48 (L) 04/08/2018 1220         RADIOGRAPHY: Mm Digital Diagnostic Unilat  R  Result Date: 03/23/2018 CLINICAL DATA:  Screening recall for right breast calcifications. EXAM: DIGITAL DIAGNOSTIC RIGHT MAMMOGRAM WITH CAD COMPARISON:  Previous exam(s). ACR Breast Density Category c: The breast tissue is heterogeneously dense, which may obscure small masses. FINDINGS: In the upper-outer quadrant of the right breast, there are 2 adjacent groups of coarse heterogeneous calcifications. The more anterior group is larger and contains more numerous calcifications spanning up to 1.0 cm and has some linearly distributed forms. The posterior group is loosely scattered spanning 0.9 cm. Mammographic images were processed with CAD. IMPRESSION: There are 2 adjacent indeterminate groups of coarse heterogeneous calcifications in the upper-outer  quadrant of the right breast. RECOMMENDATION: Stereotactic biopsy is recommended for the 2 groups of right breast calcifications. The procedure has been scheduled for 03/27/2018 at 11:30 a.m. I have discussed the findings and recommendations with the patient. Results were also provided in writing at the conclusion of the visit. If applicable, a reminder letter will be sent to the patient regarding the next appointment. BI-RADS CATEGORY  4: Suspicious. Electronically Signed   By: Ammie Ferrier M.D.   On: 03/23/2018 15:52   Mm 3d Screen Breast Bilateral  Result Date: 03/13/2018 CLINICAL DATA:  Screening. EXAM: DIGITAL SCREENING BILATERAL MAMMOGRAM WITH TOMO AND CAD COMPARISON:  Previous exam(s). ACR Breast Density Category c: The breast tissue is heterogeneously dense, which may obscure small masses. FINDINGS: In the right breast, calcifications warrant further evaluation with magnified views. In the left breast, no findings suspicious for malignancy. Images were processed with CAD. IMPRESSION: Further evaluation is suggested for calcifications in the right breast. RECOMMENDATION: Diagnostic mammogram of the right breast. (Code:[id number]) The patient will be contacted regarding the findings, and additional imaging will be scheduled. BI-RADS CATEGORY  0: Incomplete. Need additional imaging evaluation and/or prior mammograms for comparison. Electronically Signed   By: Lajean Manes M.D.   On: 03/13/2018 16:17   Mm Clip Placement Right  Result Date: 03/27/2018 CLINICAL DATA:  Status post stereotactic guided core needle biopsies of 10 mm and 9 mm groups of calcifications in the upper-outer quadrant of the right breast EXAM: DIAGNOSTIC RIGHT MAMMOGRAM POST STEREOTACTIC BIOPSY COMPARISON:  Previous exam(s). FINDINGS: Mammographic images were obtained following stereotactic guided biopsy of the recently demonstrated 10 mm and 9 mm groups of calcifications in the upper-outer quadrant of the right breast. These  demonstrate a coil shaped biopsy marker clip at the location of the biopsied 10 mm group of calcifications and an X shaped biopsy marker clip at the location of the biopsied 9 mm group of calcifications. IMPRESSION: Appropriate clip deployment following stereotactic guided core needle biopsies of the recently demonstrated 10 mm and 9 mm groups of calcifications in the upper-outer quadrant of the right breast. Final Assessment: Post Procedure Mammograms for Marker Placement Electronically Signed   By: Claudie Revering M.D.   On: 03/27/2018 13:06   Mm Rt Breast Bx W Loc Dev 1st Lesion Image Bx Spec Stereo Guide  Addendum Date: 04/01/2018   ADDENDUM REPORT: 03/31/2018 12:13 ADDENDUM: Pathology revealed HIGH GRADE DUCTAL CARCINOMA IN SITU WITH CALCIFICATIONS AND NECROSIS of the Right breast, both locations, upper outer quadrant, 10 mm group of calcifications and 9 mm group of calcifications. This was found to be concordant by Dr. Claudie Revering. Pathology results were discussed with the patient by telephone. The patient reported doing well after the biopsies with tenderness at the sites. Post biopsy instructions and care were reviewed and questions were answered. The patient was encouraged to  call [facility] for any additional concerns. The patient was referred to [facility] on April 08, 2018. Pathology results reported by Terie Purser, RN on 03/31/2018. Electronically Signed   By: Claudie Revering M.D.   On: 03/31/2018 12:13   Result Date: 04/01/2018 CLINICAL DATA:  10 mm and 9 mm groups of indeterminate calcifications in the upper-outer right breast at recent mammography. EXAM: RIGHT BREAST STEREOTACTIC CORE NEEDLE BIOPSY X 2 COMPARISON:  Previous exams. FINDINGS: The patient and I discussed the procedure of stereotactic-guided biopsy including benefits and alternatives. We discussed the high likelihood of a successful  procedure. We discussed the risks of the procedure including infection, bleeding, tissue injury, clip migration, and inadequate sampling. Informed written consent was given. The usual time out protocol was performed immediately prior to the procedure. SITE 1: 10 MM GROUP OF CALCIFICATIONS IN THE UPPER OUTER QUADRANT OF THE RIGHT BREAST Using sterile technique and 1% Lidocaine as local anesthetic, under stereotactic guidance, a 9 gauge vacuum assisted device was used to perform core needle biopsy of the recently demonstrated 10 mm group of calcifications in the upper-outer quadrant of the right breast using a lateral approach. Specimen radiograph was performed showing multiple calcifications within multiple specimens. Specimens with calcifications are identified for pathology. Lesion quadrant: Upper outer quadrant At the conclusion of the procedure, a coil shaped tissue marker clip was deployed into the biopsy cavity. Follow-up 2-view mammogram was performed and dictated separately. SITE 2: 9 MM GROUP OF CALCIFICATIONS IN THE UPPER-OUTER QUADRANT OF THE RIGHT BREAST Using sterile technique and 1% Lidocaine as local anesthetic, under stereotactic guidance, a 9 gauge vacuum assisted device was used to perform core needle biopsy of the 9 mm group of calcifications in the upper-outer quadrant of the right breast more superiorly, laterally and posteriorly using a lateral approach. Specimen radiograph was performed showing multiple calcifications within multiple specimens. Specimens with calcifications are identified for pathology. Lesion quadrant: Upper outer quadrant At the conclusion of the procedure, a X shaped tissue marker clip was deployed into the biopsy cavity. Follow-up 2-view mammogram was performed and dictated separately. IMPRESSION: Stereotactic-guided biopsy of the recently demonstrated 10 mm and 9 mm groups of calcifications in the upper-outer quadrant of the right breast. No apparent complications.  Electronically Signed: By: Claudie Revering M.D. On: 03/27/2018 12:54   Mm Rt Breast Bx W Loc Dev Ea Ad Lesion Img Bx Spec Stereo Guide  Addendum Date: 04/01/2018   ADDENDUM REPORT: 03/31/2018 12:13 ADDENDUM: Pathology revealed HIGH GRADE DUCTAL CARCINOMA IN SITU WITH CALCIFICATIONS AND NECROSIS of the Right breast, both locations, upper outer quadrant, 10 mm group of calcifications and 9 mm group of calcifications. This was found to be concordant by Dr. Claudie Revering. Pathology results were discussed with the patient by telephone. The patient reported doing well after the biopsies with tenderness at the sites. Post biopsy instructions and care were reviewed and questions were answered. The patient was encouraged to call [facility] for any additional concerns. The patient was referred to [facility] on April 08, 2018. Pathology results reported by Terie Purser, RN on 03/31/2018. Electronically Signed   By: Claudie Revering M.D.   On: 03/31/2018 12:13   Result Date: 04/01/2018 CLINICAL DATA:  10 mm and 9 mm groups of indeterminate calcifications in the upper-outer right breast at recent mammography. EXAM:  RIGHT BREAST STEREOTACTIC CORE NEEDLE BIOPSY X 2 COMPARISON:  Previous exams. FINDINGS: The patient and I discussed the procedure of stereotactic-guided biopsy including benefits and alternatives. We discussed the high likelihood of a successful procedure. We discussed the risks of the procedure including infection, bleeding, tissue injury, clip migration, and inadequate sampling. Informed written consent was given. The usual time out protocol was performed immediately prior to the procedure. SITE 1: 10 MM GROUP OF CALCIFICATIONS IN THE UPPER OUTER QUADRANT OF THE RIGHT BREAST Using sterile technique and 1% Lidocaine as local anesthetic, under stereotactic guidance, a 9 gauge vacuum assisted device was used to perform  core needle biopsy of the recently demonstrated 10 mm group of calcifications in the upper-outer quadrant of the right breast using a lateral approach. Specimen radiograph was performed showing multiple calcifications within multiple specimens. Specimens with calcifications are identified for pathology. Lesion quadrant: Upper outer quadrant At the conclusion of the procedure, a coil shaped tissue marker clip was deployed into the biopsy cavity. Follow-up 2-view mammogram was performed and dictated separately. SITE 2: 9 MM GROUP OF CALCIFICATIONS IN THE UPPER-OUTER QUADRANT OF THE RIGHT BREAST Using sterile technique and 1% Lidocaine as local anesthetic, under stereotactic guidance, a 9 gauge vacuum assisted device was used to perform core needle biopsy of the 9 mm group of calcifications in the upper-outer quadrant of the right breast more superiorly, laterally and posteriorly using a lateral approach. Specimen radiograph was performed showing multiple calcifications within multiple specimens. Specimens with calcifications are identified for pathology. Lesion quadrant: Upper outer quadrant At the conclusion of the procedure, a X shaped tissue marker clip was deployed into the biopsy cavity. Follow-up 2-view mammogram was performed and dictated separately. IMPRESSION: Stereotactic-guided biopsy of the recently demonstrated 10 mm and 9 mm groups of calcifications in the upper-outer quadrant of the right breast. No apparent complications. Electronically Signed: By: Claudie Revering M.D. On: 03/27/2018 12:54      IMPRESSION/PLAN: Right Breast DCIS   The patient is enthusiastic to proceed with right breast lumpectomy. She will also undergo genetic testing.   It was a pleasure meeting the patient today. We discussed the risks, benefits, and side effects of radiotherapy. I recommend radiotherapy to the right breast to reduce her risk of locoregional recurrence by 1/2.  We discussed that radiation would take  approximately 4 weeks to complete and that I would give the patient a few weeks to heal following surgery before starting treatment planning. We spoke about acute effects including skin irritation and fatigue as well as much less common late effects including internal organ injury or irritation. We spoke about the latest technology that is used to minimize the risk of late effects for patients undergoing radiotherapy to the breast or chest wall. No guarantees of treatment were given. The patient is enthusiastic about proceeding with treatment. I look forward to participating in the patient's care.  I will await her referral back to me for postoperative follow-up and eventual CT simulation/treatment planning. __________________________________________   Eppie Gibson, MD  This document serves as a record of services personally performed by Eppie Gibson, MD. It was created on her behalf by Rae Lips, a trained medical scribe. The creation of this record is based on the scribe's personal observations and the provider's statements to them. This document has been checked and approved by the attending provider.

## 2018-04-08 NOTE — H&P (Signed)
Mallory Barnes Documented: 04/08/2018 7:33 AM Location: [facility] Patient #: [id number] DOB: [birth year] Undefined / Language: Mallory Barnes / Race: White Female  History of Present Illness Mallory Moores A. Marqus Macphee MD; 04/08/2018 3:12 PM) Patient words: 57 yo female with screening detected mammogram revealed 10 mm and 9 mm area right breast UOQ calcifications. 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 She denies mass , discharge or other problem with her breast.  The patient is a 57 year old female.   Past Surgical History Ladell Bey, RN; 04/08/2018 7:34 AM) No pertinent past surgical history  Diagnostic Studies History Terrianna Holsclaw, RN; 04/08/2018 7:34 AM) Colonoscopy 1-5 years ago Mammogram within last year Pap Smear 1-5 years ago  Medication History Parminder Trapani, RN; 04/08/2018 7:34 AM) Medications Reconciled  Social History Yarelis Ambrosino, RN; 04/08/2018 7:34 AM) Alcohol use Occasional alcohol use. Caffeine use Carbonated beverages, Tea. No drug use Tobacco use Never smoker.  Family History Ashla Murph, RN; 04/08/2018 7:34 AM) Arthritis Sister. Diabetes Mellitus Father, Sister. Heart Disease Father. Hypertension Brother, Father. Melanoma Father. Prostate Cancer Father.  Pregnancy / Birth History Tenishia Ekman, RN; 04/08/2018 7:34 AM) Age at menarche 29 years. Age of menopause 79-50 Gravida 0 Para 0 Regular periods  Other Problems Tawni Pummel, RN; 04/08/2018 7:34 AM) Asthma Gastroesophageal Reflux Disease Lump In Breast     Review of Systems Sunday Spillers Ledford RN; 04/08/2018 7:34 AM) General Not Present- Appetite Loss, Chills, Fatigue, Fever, Night Sweats, Weight Gain and Weight Loss. Skin Not Present- Change in Wart/Mole, Dryness, Hives, Jaundice, New Lesions, Non-Healing Wounds, Rash and Ulcer. HEENT Present- Ringing in the Ears, Seasonal Allergies and Wears glasses/contact lenses. Not Present- Earache, Hearing Loss, Hoarseness, Nose  Bleed, Oral Ulcers, Sinus Pain, Sore Throat, Visual Disturbances and Yellow Eyes. Respiratory Present- Difficulty Breathing. Not Present- Bloody sputum, Chronic Cough, Snoring and Wheezing. Breast Present- Breast Mass. Not Present- Breast Pain, Nipple Discharge and Skin Changes. Cardiovascular Present- Shortness of Breath. Not Present- Chest Pain, Difficulty Breathing Lying Down, Leg Cramps, Palpitations, Rapid Heart Rate and Swelling of Extremities. Gastrointestinal Not Present- Abdominal Pain, Bloating, Bloody Stool, Change in Bowel Habits, Chronic diarrhea, Constipation, Difficulty Swallowing, Excessive gas, Gets full quickly at meals, Hemorrhoids, Indigestion, Nausea, Rectal Pain and Vomiting. Female Genitourinary Not Present- Frequency, Nocturia, Painful Urination, Pelvic Pain and Urgency. Neurological Not Present- Decreased Memory, Fainting, Headaches, Numbness, Seizures, Tingling, Tremor, Trouble walking and Weakness. Psychiatric Not Present- Anxiety, Bipolar, Change in Sleep Pattern, Depression, Fearful and Frequent crying. Endocrine Present- Hot flashes. Not Present- Cold Intolerance, Excessive Hunger, Hair Changes, Heat Intolerance and New Diabetes. Hematology Not Present- Blood Thinners, Easy Bruising, Excessive bleeding, Gland problems, HIV and Persistent Infections.   Physical Exam (Carmyn Hamm A. Tatem Fesler MD; 04/08/2018 3:12 PM)  General Mental Status-Alert. General Appearance-Consistent with stated age. Hydration-Well hydrated. Voice-Normal.  Head and Neck Head-normocephalic, atraumatic with no lesions or palpable masses.  Chest and Lung Exam Chest and lung exam reveals -quiet, even and easy respiratory effort with no use of accessory muscles and on auscultation, normal breath sounds, no adventitious sounds and normal vocal resonance. Inspection Chest Wall - Normal. Back - normal.  Cardiovascular Cardiovascular examination reveals -on palpation PMI is normal in  location and amplitude, no palpable S3 or S4. Normal cardiac borders., normal heart sounds, regular rate and rhythm with no murmurs, carotid auscultation reveals no bruits and normal pedal pulses bilaterally.  Neurologic Neurologic evaluation reveals -alert and oriented x 3 with no impairment of recent or remote memory. Mental Status-Normal.  Musculoskeletal Normal Exam - Left-Upper Extremity Strength Normal and Lower Extremity Strength Normal. Normal Exam - Right-Upper Extremity Strength Normal, Lower Extremity Weakness.    Assessment & Plan (Regena Delucchi A. Sheriden Archibeque MD; 04/08/2018 3:13 PM)  BREAST NEOPLASM, TIS (DCIS), RIGHT (D05.11) Impression: Pt has opted for right breast lumpectomy Risk of lumpectomy include bleeding, infection, seroma, more surgery, use of seed/wire, wound care, cosmetic deformity and the need for other treatments, death , blood clots, death. Pt agrees to proceed.  Current Plans You are being scheduled for surgery- Our schedulers will call you.  You should hear from our office's scheduling department within 5 working days about the location, date, and time of surgery. We try to make accommodations for patient's preferences in scheduling surgery, but sometimes the OR schedule or the surgeon's schedule prevents [place] from making those accommodations.  If you have not heard from our office [phone]) in 5 working days, call the office and ask for your surgeon's nurse.  If you have other questions about your diagnosis, plan, or surgery, call the office and ask for your surgeon's nurse.  Pt Education - [facility] Breast Cancer Information Given - [facility] "Breast Journey" Package Pt Education - Pamphlet Given - Breast Biopsy: discussed with patient and provided information. We discussed the staging and pathophysiology of breast cancer. We discussed all of the different options for treatment for breast cancer including surgery, chemotherapy, radiation therapy, Herceptin, and  antiestrogen therapy. We discussed a sentinel lymph node biopsy as she does not appear to having lymph node involvement right now. We discussed the performance of that with injection of radioactive tracer and blue dye. We discussed that she would have an incision underneath her axillary hairline. We discussed that there is a bout a 10-20% chance of having a positive node with a sentinel lymph node biopsy and we will await the permanent pathology to make any other first further decisions in terms of her treatment. One of these options might be to return to the operating room to perform an axillary lymph node dissection. We discussed about a 1-2% risk lifetime of chronic shoulder pain as well as lymphedema associated with a sentinel lymph node biopsy. We discussed the options for treatment of the breast cancer which included lumpectomy versus a mastectomy. We discussed the performance of the lumpectomy with a wire placement. We discussed a 10-20% chance of a positive margin requiring reexcision in the operating room. We also discussed that she may need radiation therapy or antiestrogen therapy or both if she undergoes lumpectomy. We discussed the mastectomy and the postoperative care for that as well. We discussed that there is no difference in her survival whether she undergoes lumpectomy with radiation therapy or antiestrogen therapy versus a mastectomy. There is a slight difference in the local recurrence rate being 3-5% with lumpectomy and about 1% with a mastectomy. We discussed the risks of operation including bleeding, infection, possible reoperation. She understands her further therapy will be based on what her stages at the time of her operation.  Pt Education - [facility] breast cancer surgery: discussed with patient and provided information. Pt Education - ABC (After Breast Cancer) Class Info: discussed with patient and provided information.

## 2018-04-08 NOTE — Progress Notes (Signed)
[facility] NOTE  Patient Care Team: Tisovec, Fransico Him, MD as PCP - General (Internal Medicine) Erroll Luna, MD as Consulting Physician (General Surgery) Nicholas Lose, MD as Consulting Physician (Hematology and Oncology) Eppie Gibson, MD as Attending Physician (Radiation Oncology)  CHIEF COMPLAINTS/PURPOSE OF CONSULTATION:  Newly diagnosed breast cancer  HISTORY OF PRESENTING ILLNESS:  Mallory Barnes 57 y.o. female is here because of recent diagnosis of right breast DCIS.  Patient had a routine screening mammogram that detected abnormalities in the right breast and 2 areas that are very close to each other.  Biopsy of the 0 yes revealed high-grade DCIS with calcifications and necrosis.  She was presented this morning to the multidisciplinary tumor board and she is here today at the [facility] clinic to discuss her treatment plan.  I reviewed her records extensively and collaborated the history with the patient.  SUMMARY OF ONCOLOGIC HISTORY:   Ductal carcinoma in situ (DCIS) of right breast   03/27/2018 Initial Diagnosis    Screening detected right breast calcifications in 2 areas anteriorly 1 cm size and posteriorly 0.9 cm in size, biopsy of both of these came back as high-grade DCIS with calcifications and necrosis ER 0%, PR 0%, Tis NX stage 0      MEDICAL HISTORY:  Past Medical History:  Diagnosis Date  . Allergy   . Asthma   . Breast lump 01/2002   cystic, bilateral  . Chronic bronchitis ([place])   . Diverticulosis   . Fractured toe 1990's   right 5th toe  . GERD (gastroesophageal reflux disease)   . Shingles 10/2006   right facial  . UTI (urinary tract infection)    frequent    SURGICAL HISTORY: Past Surgical History:  Procedure Laterality Date  . COLONOSCOPY      SOCIAL HISTORY: Social History   Socioeconomic History  . Marital status: Single    Spouse name: Not on file  . Number of children: 0  . Years of education: Not on file  .  Highest education level: Not on file  Occupational History  . Occupation: Occupational psychologist  Social Needs  . Financial resource strain: Not on file  . Food insecurity:    Worry: Not on file    Inability: Not on file  . Transportation needs:    Medical: Not on file    Non-medical: Not on file  Tobacco Use  . Smoking status: Never Smoker  . Smokeless tobacco: Never Used  Substance and Sexual Activity  . Alcohol use: Yes    Comment: ocass  . Drug use: No  . Sexual activity: Not on file  Lifestyle  . Physical activity:    Days per week: Not on file    Minutes per session: Not on file  . Stress: Not on file  Relationships  . Social connections:    Talks on phone: Not on file    Gets together: Not on file    Attends religious service: Not on file    Active member of club or organization: Not on file    Attends meetings of clubs or organizations: Not on file    Relationship status: Not on file  . Intimate partner violence:    Fear of current or ex partner: Not on file    Emotionally abused: Not on file    Physically abused: Not on file    Forced sexual activity: Not on file  Other Topics Concern  . 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 Colon cancer Maternal Grandmother   . Colon cancer Maternal Uncle   . Prostate cancer Father   . Diabetes Father   . Cancer Father        oral  . Hypertension Father   . Heart attack Mother   . Pneumonia Mother   . Alzheimer's disease Mother   . Emphysema Sister   . Diabetes Sister   . Hodgkin's lymphoma Sister   . Breast cancer Sister   . Ovarian cancer Sister     ALLERGIES:  is allergic to doxycycline; ketek [telithromycin]; and penicillins.  MEDICATIONS:  Current Outpatient  Medications  Medication Sig Dispense Refill  . albuterol (PROVENTIL HFA;VENTOLIN HFA) 108 (90 BASE) MCG/ACT inhaler Inhale 2 puffs into the lungs. 2 puffs pre exercise prn    . BREO ELLIPTA 100-25 MCG/INH AEPB   11  . diphenhydrAMINE (BENADRYL) 25 MG tablet Take 25 mg by mouth every 6 (six) hours as needed.    Marland Kitchen GLUCOSAMINE-CHONDROITIN-MSM PO Take by mouth 2 (two) times daily.    Marland Kitchen ibuprofen (ADVIL,MOTRIN) 200 MG tablet Take 200 mg by mouth. 1 tab twice daily    . milk thistle 175 MG tablet Take 175 mg by mouth daily. Per patient, 1000 mg daily    . montelukast (SINGULAIR) 10 MG tablet   2  . MULTIPLE VITAMIN PO Take by mouth.    Marland Kitchen OVER THE COUNTER MEDICATION Blach Cohosh 540 mg daily.    Marland Kitchen OVER THE COUNTER MEDICATION Afrin pump mist only as needed for nasal congestion    . pantoprazole (PROTONIX) 40 MG tablet Take 40 mg by mouth daily.     No current facility-administered medications for this visit.     REVIEW OF SYSTEMS:   Constitutional: Denies fevers, chills or abnormal night sweats Eyes: Denies blurriness of vision, double vision or watery eyes Ears, nose, mouth, throat, and face: Denies mucositis or sore throat Respiratory: Denies cough, dyspnea or wheezes Cardiovascular: Denies palpitation, chest discomfort or lower extremity swelling Gastrointestinal:  Denies nausea, heartburn or change in bowel habits Skin: Denies abnormal skin rashes Lymphatics: Denies new lymphadenopathy or easy bruising Neurological:Denies numbness, tingling or new weaknesses Behavioral/Psych: Mood is stable, no new changes  Breast:  Denies any palpable lumps or discharge All other systems were reviewed with the patient and are negative.  PHYSICAL EXAMINATION: ECOG PERFORMANCE STATUS: 0 - Asymptomatic  Vitals:   04/08/18 1231  BP: (!) 143/95  Pulse: 88  Resp: 18  Temp: 98.8 F (37.1 C)  SpO2: 97%   Filed Weights   04/08/18 1231  Weight: 132 lb 4.8 oz (60 kg)    GENERAL:alert, no distress  and comfortable SKIN: skin color, texture, turgor are normal, no rashes or significant lesions EYES: normal, conjunctiva are pink and non-injected, sclera clear OROPHARYNX:no exudate, no erythema and lips, buccal mucosa, and tongue normal  NECK: supple, thyroid normal size, non-tender, without nodularity LYMPH:  no palpable lymphadenopathy in the cervical, axillary or inguinal LUNGS: clear to auscultation and percussion with normal breathing effort HEART: regular rate & rhythm and no murmurs and no lower extremity edema ABDOMEN:abdomen soft, non-tender and normal bowel sounds Musculoskeletal:no cyanosis of digits and no clubbing  PSYCH: alert & oriented  x 3 with fluent speech NEURO: no focal motor/sensory deficits BREAST: No palpable nodules in breast. No palpable axillary or supraclavicular lymphadenopathy (exam performed in the presence of a chaperone)   LABORATORY DATA:  I have reviewed the data as listed Lab Results  Component Value Date   WBC 4.9 04/08/2018   HGB 13.3 04/08/2018   HCT 39.9 04/08/2018   MCV 89.3 04/08/2018   PLT 254 04/08/2018   Lab Results  Component Value Date   NA 141 04/08/2018   K 4.2 04/08/2018   CL 104 04/08/2018   CO2 26 04/08/2018    RADIOGRAPHIC STUDIES: I have personally reviewed the radiological reports and agreed with the findings in the report.  ASSESSMENT AND PLAN:  Ductal carcinoma in situ (DCIS) of right breast 03/27/2018:Screening detected right breast calcifications in 2 areas anteriorly 1 cm size and posteriorly 0.9 cm in size, biopsy of both of these came back as high-grade DCIS with calcifications and necrosis ER 0%, PR 0%, Tis NX stage 0  Pathology review: I discussed with the patient the difference between DCIS and invasive breast cancer. It is considered a precancerous lesion. DCIS is classified as a 0. It is generally detected through mammograms as calcifications. We discussed the significance of grades and its impact on prognosis.  We also discussed the importance of ER and PR receptors and their implications to adjuvant treatment options. Prognosis of DCIS dependence on grade, comedo necrosis. It is anticipated that if not treated, 20-30% of DCIS can develop into invasive breast cancer.  Recommendation: 1. Breast conserving surgery 2. Followed by adjuvant radiation therapy 3.  Genetics consultation because her sister had breast cancer at a young age   Return to clinic after surgery to discuss the final pathology report and come up with an adjuvant treatment plan.     All questions were answered. The patient knows to call the clinic with any problems, questions or concerns.    Harriette Ohara, MD 04/08/18

## 2018-04-13 ENCOUNTER — Inpatient Hospital Stay: Payer: BLUE CROSS/BLUE SHIELD

## 2018-04-13 ENCOUNTER — Inpatient Hospital Stay (HOSPITAL_BASED_OUTPATIENT_CLINIC_OR_DEPARTMENT_OTHER): Payer: BLUE CROSS/BLUE SHIELD | Admitting: Genetic Counselor

## 2018-04-13 ENCOUNTER — Other Ambulatory Visit: Payer: Self-pay | Admitting: Surgery

## 2018-04-13 ENCOUNTER — Encounter: Payer: Self-pay | Admitting: Genetic Counselor

## 2018-04-13 DIAGNOSIS — Z809 Family history of malignant neoplasm, unspecified: Secondary | ICD-10-CM

## 2018-04-13 DIAGNOSIS — D0511 Intraductal carcinoma in situ of right breast: Secondary | ICD-10-CM | POA: Diagnosis not present

## 2018-04-13 DIAGNOSIS — Z803 Family history of malignant neoplasm of breast: Secondary | ICD-10-CM

## 2018-04-13 DIAGNOSIS — Z1379 Encounter for other screening for genetic and chromosomal anomalies: Secondary | ICD-10-CM

## 2018-04-13 DIAGNOSIS — Z8041 Family history of malignant neoplasm of ovary: Secondary | ICD-10-CM | POA: Diagnosis not present

## 2018-04-13 DIAGNOSIS — Z800 Family history of malignant neoplasm of digestive organs: Secondary | ICD-10-CM | POA: Diagnosis not present

## 2018-04-13 NOTE — Progress Notes (Signed)
[facility]      Initial Visit   Patient Name: Mallory Barnes Patient DOB: Sep 16, 1961 Patient Age: 57 y.o. Encounter Date: 04/13/2018  Referring Provider: Nicholas Lose, MD  Primary Care Provider: Haywood Pao, MD  Reason for Visit: Evaluate for hereditary susceptibility to cancer    Assessment and Plan:  . Mallory Barnes's personal history of breast cancer at age 11 is not highly suggestive of a hereditary predisposition to cancer. A genetics evaluation is indicated however, due to her family history of breast and ovarian cancer in a paternal half-sister and prostate cancers in her father and paternal grandfather.  . Testing is recommended to determine whether she has a pathogenic mutation that will impact her screening and risk-reduction for cancer. A negative result will be reassuring.  . Mallory Barnes wished to pursue genetic testing and a blood sample will be sent for analysis of the 83 genes on [facility]'s Multi-Cancer panel (ALK, APC, ATM, AXIN2, BAP1, BARD1, BLM, BMPR1A, BRCA1, BRCA2, BRIP1, CASR, CDC73, CDH1, CDK4, CDKN1B, CDKN1C, CDKN2A, CEBPA, CHEK2, CTNNA1, DICER1, DIS3L2, EGFR, EPCAM, FH, FLCN, GATA2, GPC3, GREM1, HOXB13, HRAS, KIT, MAX, MEN1, MET, MITF, MLH1, MSH2, MSH3, MSH6, MUTYH, NBN, NF1, NF2, NTHL1, PALB2, PDGFRA, PHOX2B, PMS2, POLD1, POLE, POT1, PRKAR1A, PTCH1, PTEN, RAD50, RAD51C, RAD51D, RB1, RECQL4, RET, RUNX1, SDHA, SDHAF2, SDHB, SDHC, SDHD, SMAD4, SMARCA4, SMARCB1, SMARCE1, STK11, SUFU, TERC, TERT, TMEM127, TP53, TSC1, TSC2, VHL, WRN, WT1).   . Results should be available in approximately 2-3 weeks, at which point we will contact her and address implications for her as well as address genetic testing for at-risk family members, if needed.     Dr. Lindi Adie was available for questions concerning this case. Total time spent by me in face-to-face counseling was approximately 30 minutes.    _____________________________________________________________________   History of Present Illness: Mallory Barnes, a 57 y.o. female, is being seen at the [facility] due to a personal and family history of cancer. She presents to clinic today to discuss the possibility of a hereditary predisposition to cancer and discuss whether genetic testing is warranted.  Mallory Barnes was recently diagnosed with breast cancer (DCIS) at the age of 58. She indicated that she will be using results of genetic testing to decide which surgery to have, but that surgery will be in mid-July because of vacation plans she has.     Ductal carcinoma in situ (DCIS) of right breast   03/27/2018 Initial Diagnosis    Screening detected right breast calcifications in 2 areas anteriorly 1 cm size and posteriorly 0.9 cm in size, biopsy of both of these came back as high-grade DCIS with calcifications and necrosis ER 0%, PR 0%, Tis NX stage 0       Past Medical History:  Diagnosis Date  . Allergy   . Asthma   . Breast lump 01/2002   cystic, bilateral  . Chronic bronchitis ([place])   . Diverticulosis   . Family history of breast cancer   . Family history of cancer   . Fractured toe 1990's   right 5th toe  . GERD (gastroesophageal reflux disease)   . Shingles 10/2006   right facial  . UTI (urinary tract infection)    frequent    Past Surgical History:  Procedure Laterality Date  . COLONOSCOPY      Social History   Socioeconomic History  . Marital status: Single    Spouse name: Not on  file  . Number of children: 0  . Years of education: Not on file  . Highest education level: Not on file  Occupational History  . Occupation: Occupational psychologist  Social Needs  . Financial resource strain: Not on file  . Food insecurity:    Worry: Not on file    Inability: Not on file  . Transportation needs:    Medical: Not on file    Non-medical: Not on file  Tobacco Use  . Smoking status:  Never Smoker  . Smokeless tobacco: Never Used  Substance and Sexual Activity  . Alcohol use: Yes    Comment: ocass  . Drug use: No  . Sexual activity: Not on file  Lifestyle  . Physical activity:    Days per week: Not on file    Minutes per session: Not on file  . Stress: Not on file  Relationships  . Social connections:    Talks on phone: Not on file    Gets together: Not on file    Attends religious service: Not on file    Active member of club or organization: Not on file    Attends meetings of clubs or organizations: Not on file    Relationship status: Not on file  Other Topics Concern  . 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 Family tree will be scanned in the Media tab in Epic  Significant diagnoses include the following:  Family History  Problem Relation Age of Onset  . Colon cancer Maternal Grandmother 38       deceased 40s  . Colon cancer Maternal Uncle   . Prostate cancer Father 52       oral cancer later  . Diabetes Father   . Hypertension Father   . Heart attack Mother   . Pneumonia Mother   . Alzheimer's disease Mother   . Hodgkin's lymphoma Sister   . Diabetes Sister   . Prostate cancer Paternal Grandfather        deceased 9s  . Breast cancer Other        paternal half-sister; dx 73s  . Ovarian cancer Other 15       same paternal half-sister    Additionally, Mallory Barnes has no children. She has a full brother and sister; she also has a paternal half-brother and half-sister. Her mother had only one brother. Her father had 2 brothers (deceased) and 2 sisters (ages 31s and 65s).  Mallory Barnes ancestry is Caucasian - NOS. There is no known Jewish ancestry and no  consanguinity.  Discussion: We reviewed the characteristics, features and inheritance patterns of hereditary cancer syndromes. We discussed her risk of harboring a mutation in the context of her personal and family history. We discussed the process of genetic testing, insurance coverage and implications of results: positive, negative and variant of unknown significance (VUS).    Mallory Barnes questions were answered to her satisfaction today and she is welcome to call with any additional questions or concerns. Thank you for the referral and allowing [place] to share in the care of your patient.    Steele Berg, MS, [place] Certified Genetic Counselor phone: [phone] [email or web address]

## 2018-04-14 ENCOUNTER — Telehealth: Payer: Self-pay | Admitting: *Deleted

## 2018-04-14 DIAGNOSIS — D0511 Intraductal carcinoma in situ of right breast: Secondary | ICD-10-CM

## 2018-04-14 NOTE — Telephone Encounter (Signed)
  Oncology Nurse Navigator Documentation  Navigator Location: [facility] (04/14/18 1600)   )Navigator Encounter Type: Telephone;[facility]C Follow-up (04/14/18 1600) Telephone: Outgoing Call;Clinic/[facility] Follow-up (04/14/18 1600)     Surgery Date: 05/20/18 (04/14/18 1600) Genetic Counseling Date: 04/13/18 (04/14/18 1600) Genetic Counseling Type: Urgent (04/14/18 1600)                                        Time Spent with Patient: 15 (04/14/18 1600)

## 2018-04-15 ENCOUNTER — Telehealth: Payer: Self-pay | Admitting: Hematology and Oncology

## 2018-04-15 NOTE — Telephone Encounter (Signed)
Left message for patient regarding upcoming July appointments per 6/11 sch message

## 2018-04-16 ENCOUNTER — Encounter: Payer: Self-pay | Admitting: Radiation Oncology

## 2018-04-16 ENCOUNTER — Encounter: Payer: Self-pay | Admitting: General Practice

## 2018-04-16 NOTE — Progress Notes (Signed)
[place] Psychosocial Distress Screening Spiritual Care  Spoke with Evangelia by phone today following [facility] on 04/08/18 to introduce Support Center team/resources, reviewing distress screen per protocol.  The patient scored a 8 on the Psychosocial Distress Thermometer which indicates severe distress. Also assessed for distress and other psychosocial needs.   ONCBCN DISTRESS SCREENING 04/16/2018  Screening Type Initial Screening  Distress experienced in past week (1-10) 8  Family Problem type Other (comment)  Emotional problem type Nervousness/Anxiety;Adjusting to illness  Information Concerns Type Lack of info about diagnosis;Lack of info about treatment;Lack of info about complementary therapy choices  Physical Problem type Sleep/insomnia  Referral to support programs Yes   Laquia states that her sleep is returning to normal, and anxiety overall decreasing, since meeting her team and learning of tx plan in [facility]. Per pt, she is having "more good days than bad." She is supporting a friend through the death of the friend's mother, which is both a stressor and a distraction. Rosina reports good support from her sister (who went through tx for Hodgkin's disease) and a close friend in [place] who had a similar breast ca dx and xrt two summers ago.   Follow up needed: No. Per pt, at this point she has no other needs and knows to contact Team anytime, but please also page if needs arise or circumstances change. Thank you!   [place], [place], [facility] Pager [phone] Voicemail [phone]

## 2018-04-26 ENCOUNTER — Encounter: Payer: Self-pay | Admitting: Genetic Counselor

## 2018-04-26 DIAGNOSIS — Z1379 Encounter for other screening for genetic and chromosomal anomalies: Secondary | ICD-10-CM

## 2018-04-26 HISTORY — DX: Encounter for other screening for genetic and chromosomal anomalies: Z13.79

## 2018-04-26 NOTE — Progress Notes (Signed)
[facility]       Genetic Test Results    Patient Name: Mallory Barnes Patient DOB: [birth year] Patient Age: 57 y.o. Encounter Date: 04/27/2018  Referring Provider: Nicholas Lose, MD  Primary Care Provider: Haywood Pao, MD   Mallory Barnes was called today to discuss genetic test results. Please see the Genetics note from Mallory Barnes visit on 04/13/2018 for a detailed discussion of Mallory Barnes personal and family history.  Genetic Testing: At the time of Mallory Barnes's visit, she decided to pursue genetic testing of multiple genes associated with hereditary susceptibility to cancer. Testing included sequencing and deletion/duplication analysis. Testing did not reveal a pathogenic mutation in any of the genes analyzed.  A copy of the genetic test report will be scanned into Epic under the Media tab.  The genes analyzed were the 83 genes on [facility]'s Multi-Cancer panel (ALK, APC, ATM, AXIN2, BAP1, BARD1, BLM, BMPR1A, BRCA1, BRCA2, BRIP1, CASR, CDC73, CDH1, CDK4, CDKN1B, CDKN1C, CDKN2A, CEBPA, CHEK2, CTNNA1, DICER1, DIS3L2, EGFR, EPCAM, FH, FLCN, GATA2, GPC3, GREM1, HOXB13, HRAS, KIT, MAX, MEN1, MET, MITF, MLH1, MSH2, MSH3, MSH6, MUTYH, NBN, NF1, NF2, NTHL1, PALB2, PDGFRA, PHOX2B, PMS2, POLD1, POLE, POT1, PRKAR1A, PTCH1, PTEN, RAD50, RAD51C, RAD51D, RB1, RECQL4, RET, RUNX1, SDHA, SDHAF2, SDHB, SDHC, SDHD, SMAD4, SMARCA4, SMARCB1, SMARCE1, STK11, SUFU, TERC, TERT, TMEM127, TP53, TSC1, TSC2, VHL, WRN, WT1).  Since the current test is not perfect, it is possible that there may be a gene mutation that current testing cannot detect, but that chance is small. It is possible that a different genetic factor, which has not yet been discovered or is not on this panel, is responsible for the cancer diagnoses in the family. Again, the likelihood of this is low. No additional testing is recommended at this time for Mallory Barnes.  A Variant of Uncertain Significance was detected: MSH3  c.2041C>T (p.Pro681Ser). This is still considered a normal result. While at this time, it is unknown if this finding is associated with increased cancer risk, the majority of these variants get reclassified to be inconsequential. Medical management should not be based on this finding. With time, we suspect the lab will determine the significance, if any. If we do learn more about it, we will try to contact Mallory Barnes to discuss it further. It is important to stay in touch with [place] periodically and keep the address and phone number up to date.  Cancer Screening: These results suggest that Mallory Barnes's cancer was most likely not due to an inherited predisposition. Most cancers happen by chance and this test, along with details of Mallory Barnes family history, suggests that Mallory Barnes cancer falls into this category. She is recommended to follow the cancer screening guidelines provided by Mallory Barnes physician.   Family Members: Family members are at some increased risk of developing cancer, over the general population risk, simply due to the family history. Women are recommended to have a yearly mammogram beginning at age 47, a yearly clinical breast exam, a yearly gynecologic exam and perform monthly breast self-exams. Colon cancer screening is recommended to begin by age 37 in both men and women, unless there is a family history of colon cancer or colon polyps or an individual has a personal history to warrant initiating screening at a younger age.  Any relative who had cancer at a young age or had a particularly rare cancer may also wish to pursue genetic testing. Genetic counselors can  be located in other cities, by visiting the website of the [employer] ([employer]ist[employer]se) and Field seismologist for a Dietitian by zip code.   Family members are not recommended to get tested for the above VUS outside of a research protocol as this finding has no implications for their medical management.  Lastly, cancer  genetics is a rapidly advancing field and it is possible that new genetic tests will be appropriate for Mallory Barnes in the future. Mallory Barnes is encouraged to remain in contact with Genetics on an annual basis so we can update Mallory Barnes personal and family histories, and let Mallory Barnes know of advances in cancer genetics that may benefit the family. Mallory Barnes questions were answered to Mallory Barnes satisfaction today, and she knows she is welcome to call anytime with additional questions.     Steele Berg, MS, [place] Certified Genetic Counselor phone: [phone]

## 2018-04-27 ENCOUNTER — Ambulatory Visit: Payer: Self-pay | Admitting: Genetic Counselor

## 2018-04-27 DIAGNOSIS — Z1379 Encounter for other screening for genetic and chromosomal anomalies: Secondary | ICD-10-CM

## 2018-05-04 ENCOUNTER — Ambulatory Visit: Payer: Self-pay | Admitting: Surgery

## 2018-05-04 DIAGNOSIS — D0511 Intraductal carcinoma in situ of right breast: Secondary | ICD-10-CM

## 2018-05-04 DIAGNOSIS — C801 Malignant (primary) neoplasm, unspecified: Secondary | ICD-10-CM

## 2018-05-04 HISTORY — DX: Malignant (primary) neoplasm, unspecified: C80.1

## 2018-05-11 ENCOUNTER — Ambulatory Visit: Payer: Self-pay | Admitting: Surgery

## 2018-05-11 DIAGNOSIS — D0510 Intraductal carcinoma in situ of unspecified breast: Secondary | ICD-10-CM

## 2018-05-12 ENCOUNTER — Other Ambulatory Visit: Payer: Self-pay

## 2018-05-12 ENCOUNTER — Encounter (HOSPITAL_BASED_OUTPATIENT_CLINIC_OR_DEPARTMENT_OTHER): Payer: Self-pay | Admitting: *Deleted

## 2018-05-19 ENCOUNTER — Ambulatory Visit
Admission: RE | Admit: 2018-05-19 | Discharge: 2018-05-19 | Disposition: A | Discharge status: Home / Self Care | Payer: BLUE CROSS/BLUE SHIELD | Source: Ambulatory Visit | Attending: Surgery | Admitting: Surgery

## 2018-05-19 DIAGNOSIS — D0511 Intraductal carcinoma in situ of right breast: Secondary | ICD-10-CM | POA: Diagnosis not present

## 2018-05-19 DIAGNOSIS — N6489 Other specified disorders of breast: Secondary | ICD-10-CM | POA: Diagnosis not present

## 2018-05-19 NOTE — Progress Notes (Signed)
Ensure pre surgery drink given with instructions to complete by 0430 dos, surgical soap given with instructions, pt verbalized understanding. 

## 2018-05-20 ENCOUNTER — Ambulatory Visit (HOSPITAL_BASED_OUTPATIENT_CLINIC_OR_DEPARTMENT_OTHER): Payer: BLUE CROSS/BLUE SHIELD | Admitting: Anesthesiology

## 2018-05-20 ENCOUNTER — Other Ambulatory Visit: Payer: Self-pay

## 2018-05-20 ENCOUNTER — Encounter (HOSPITAL_BASED_OUTPATIENT_CLINIC_OR_DEPARTMENT_OTHER): Payer: Self-pay

## 2018-05-20 ENCOUNTER — Ambulatory Visit
Admission: RE | Admit: 2018-05-20 | Discharge: 2018-05-20 | Disposition: A | Discharge status: Home / Self Care | Payer: BLUE CROSS/BLUE SHIELD | Source: Ambulatory Visit | Attending: Surgery | Admitting: Surgery

## 2018-05-20 ENCOUNTER — Encounter (HOSPITAL_BASED_OUTPATIENT_CLINIC_OR_DEPARTMENT_OTHER)
Admission: RE | Disposition: A | Discharge status: Home / Self Care | Payer: Self-pay | Source: Ambulatory Visit | Attending: Surgery

## 2018-05-20 ENCOUNTER — Ambulatory Visit (HOSPITAL_BASED_OUTPATIENT_CLINIC_OR_DEPARTMENT_OTHER)
Admission: RE | Admit: 2018-05-20 | Discharge: 2018-05-20 | Disposition: A | Discharge status: Home / Self Care | Payer: BLUE CROSS/BLUE SHIELD | Source: Ambulatory Visit | Attending: Surgery | Admitting: Surgery

## 2018-05-20 DIAGNOSIS — D0511 Intraductal carcinoma in situ of right breast: Secondary | ICD-10-CM | POA: Insufficient documentation

## 2018-05-20 DIAGNOSIS — J45909 Unspecified asthma, uncomplicated: Secondary | ICD-10-CM | POA: Diagnosis not present

## 2018-05-20 DIAGNOSIS — N6489 Other specified disorders of breast: Secondary | ICD-10-CM | POA: Diagnosis not present

## 2018-05-20 DIAGNOSIS — C50911 Malignant neoplasm of unspecified site of right female breast: Secondary | ICD-10-CM | POA: Diagnosis not present

## 2018-05-20 DIAGNOSIS — K219 Gastro-esophageal reflux disease without esophagitis: Secondary | ICD-10-CM | POA: Diagnosis not present

## 2018-05-20 HISTORY — DX: Malignant (primary) neoplasm, unspecified: C80.1

## 2018-05-20 HISTORY — PX: BREAST LUMPECTOMY WITH RADIOACTIVE SEED LOCALIZATION: SHX6424

## 2018-05-20 HISTORY — DX: Other seasonal allergic rhinitis: J30.2

## 2018-05-20 SURGERY — BREAST LUMPECTOMY WITH RADIOACTIVE SEED LOCALIZATION
Anesthesia: General | Site: Breast | Laterality: Right

## 2018-05-20 MED ORDER — DEXAMETHASONE SODIUM PHOSPHATE 4 MG/ML IJ SOLN
INTRAMUSCULAR | Status: DC | PRN
  Administered 2018-05-20: 10 mg via INTRAVENOUS

## 2018-05-20 MED ORDER — PHENYLEPHRINE HCL 10 MG/ML IJ SOLN
INTRAMUSCULAR | Status: DC | PRN
  Administered 2018-05-20 (×6): 80 ug via INTRAVENOUS

## 2018-05-20 MED ORDER — FENTANYL CITRATE (PF) 100 MCG/2ML IJ SOLN: 50.0000 ug | INTRAMUSCULAR | Status: DC | PRN

## 2018-05-20 MED ORDER — ACETAMINOPHEN 500 MG PO TABS
ORAL_TABLET | ORAL | Status: AC
  Filled 2018-05-20: qty 2

## 2018-05-20 MED ORDER — MIDAZOLAM HCL 2 MG/2ML IJ SOLN
INTRAMUSCULAR | Status: AC
  Filled 2018-05-20: qty 2

## 2018-05-20 MED ORDER — BUPIVACAINE-EPINEPHRINE (PF) 0.25% -1:200000 IJ SOLN
INTRAMUSCULAR | Status: DC | PRN
  Administered 2018-05-20: 20 mL via PERINEURAL
  Administered 2018-05-20: 10 mL via PERINEURAL

## 2018-05-20 MED ORDER — DEXAMETHASONE SODIUM PHOSPHATE 10 MG/ML IJ SOLN
INTRAMUSCULAR | Status: AC
  Filled 2018-05-20: qty 1

## 2018-05-20 MED ORDER — LIDOCAINE HCL (CARDIAC) PF 100 MG/5ML IV SOSY
PREFILLED_SYRINGE | INTRAVENOUS | Status: AC
  Filled 2018-05-20: qty 5

## 2018-05-20 MED ORDER — CHLORHEXIDINE GLUCONATE CLOTH 2 % EX PADS: 6.0000 | MEDICATED_PAD | Freq: Once | CUTANEOUS | Status: DC

## 2018-05-20 MED ORDER — MIDAZOLAM HCL 5 MG/5ML IJ SOLN
INTRAMUSCULAR | Status: DC | PRN
  Administered 2018-05-20: 2 mg via INTRAVENOUS

## 2018-05-20 MED ORDER — PHENYLEPHRINE 40 MCG/ML (10ML) SYRINGE FOR IV PUSH (FOR BLOOD PRESSURE SUPPORT)
PREFILLED_SYRINGE | INTRAVENOUS | Status: AC
  Filled 2018-05-20: qty 10

## 2018-05-20 MED ORDER — PROPOFOL 10 MG/ML IV BOLUS
INTRAVENOUS | Status: DC | PRN
  Administered 2018-05-20: 150 mg via INTRAVENOUS

## 2018-05-20 MED ORDER — ACETAMINOPHEN 500 MG PO TABS
1000.0000 mg | ORAL_TABLET | ORAL | Status: AC
  Administered 2018-05-20: 1000 mg via ORAL

## 2018-05-20 MED ORDER — MIDAZOLAM HCL 2 MG/2ML IJ SOLN: 1.0000 mg | INTRAMUSCULAR | Status: DC | PRN

## 2018-05-20 MED ORDER — FENTANYL CITRATE (PF) 100 MCG/2ML IJ SOLN: 25.0000 ug | INTRAMUSCULAR | Status: DC | PRN

## 2018-05-20 MED ORDER — LIDOCAINE HCL (CARDIAC) PF 100 MG/5ML IV SOSY
PREFILLED_SYRINGE | INTRAVENOUS | Status: DC | PRN
  Administered 2018-05-20: 100 mg via INTRAVENOUS

## 2018-05-20 MED ORDER — LACTATED RINGERS IV SOLN
INTRAVENOUS | Status: DC
  Administered 2018-05-20 (×2): via INTRAVENOUS

## 2018-05-20 MED ORDER — GABAPENTIN 300 MG PO CAPS
ORAL_CAPSULE | ORAL | Status: AC
  Filled 2018-05-20: qty 1

## 2018-05-20 MED ORDER — PROPOFOL 10 MG/ML IV BOLUS
INTRAVENOUS | Status: AC
  Filled 2018-05-20: qty 20

## 2018-05-20 MED ORDER — ONDANSETRON HCL 4 MG/2ML IJ SOLN
INTRAMUSCULAR | Status: DC | PRN
  Administered 2018-05-20: 4 mg via INTRAVENOUS

## 2018-05-20 MED ORDER — FENTANYL CITRATE (PF) 100 MCG/2ML IJ SOLN
INTRAMUSCULAR | Status: AC
  Filled 2018-05-20: qty 2

## 2018-05-20 MED ORDER — BUPIVACAINE-EPINEPHRINE (PF) 0.25% -1:200000 IJ SOLN
INTRAMUSCULAR | Status: AC
  Filled 2018-05-20: qty 30

## 2018-05-20 MED ORDER — CELECOXIB 200 MG PO CAPS
ORAL_CAPSULE | ORAL | Status: AC
  Filled 2018-05-20: qty 2

## 2018-05-20 MED ORDER — ONDANSETRON HCL 4 MG/2ML IJ SOLN
INTRAMUSCULAR | Status: AC
  Filled 2018-05-20: qty 2

## 2018-05-20 MED ORDER — FENTANYL CITRATE (PF) 100 MCG/2ML IJ SOLN
INTRAMUSCULAR | Status: DC | PRN
  Administered 2018-05-20 (×2): 50 ug via INTRAVENOUS

## 2018-05-20 MED ORDER — CLINDAMYCIN PHOSPHATE 900 MG/50ML IV SOLN
900.0000 mg | INTRAVENOUS | Status: AC
  Administered 2018-05-20: 900 mg via INTRAVENOUS

## 2018-05-20 MED ORDER — SCOPOLAMINE 1 MG/3DAYS TD PT72: 1.0000 | MEDICATED_PATCH | Freq: Once | TRANSDERMAL | Status: DC | PRN

## 2018-05-20 MED ORDER — IBUPROFEN 800 MG PO TABS: 800.0000 mg | ORAL_TABLET | Freq: Three times a day (TID) | ORAL | 0 refills | Status: DC | PRN

## 2018-05-20 MED ORDER — CLINDAMYCIN PHOSPHATE 900 MG/50ML IV SOLN
INTRAVENOUS | Status: AC
  Filled 2018-05-20: qty 50

## 2018-05-20 MED ORDER — GABAPENTIN 300 MG PO CAPS
300.0000 mg | ORAL_CAPSULE | ORAL | Status: AC
  Administered 2018-05-20: 300 mg via ORAL

## 2018-05-20 MED ORDER — CELECOXIB 400 MG PO CAPS
400.0000 mg | ORAL_CAPSULE | ORAL | Status: AC
  Administered 2018-05-20: 400 mg via ORAL

## 2018-05-20 MED ORDER — OXYCODONE HCL 5 MG PO TABS: 5.0000 mg | ORAL_TABLET | Freq: Four times a day (QID) | ORAL | 0 refills | Status: DC | PRN

## 2018-05-20 MED ORDER — PROMETHAZINE HCL 25 MG/ML IJ SOLN: 6.2500 mg | INTRAMUSCULAR | Status: DC | PRN

## 2018-05-20 SURGICAL SUPPLY — 52 items
ADH SKN CLS APL DERMABOND .7 (GAUZE/BANDAGES/DRESSINGS) ×1
APPLIER CLIP 9.375 MED OPEN (MISCELLANEOUS) ×2
APR CLP MED 9.3 20 MLT OPN (MISCELLANEOUS) ×1
BINDER BREAST LRG (GAUZE/BANDAGES/DRESSINGS) IMPLANT
BINDER BREAST MEDIUM (GAUZE/BANDAGES/DRESSINGS) ×1 IMPLANT
BINDER BREAST XLRG (GAUZE/BANDAGES/DRESSINGS) IMPLANT
BINDER BREAST XXLRG (GAUZE/BANDAGES/DRESSINGS) IMPLANT
BLADE SURG 15 STRL LF DISP TIS (BLADE) ×1 IMPLANT
BLADE SURG 15 STRL SS (BLADE) ×2
CANISTER SUC SOCK COL 7IN (MISCELLANEOUS) IMPLANT
CANISTER SUCT 1200ML W/VALVE (MISCELLANEOUS) IMPLANT
CHLORAPREP W/TINT 26ML (MISCELLANEOUS) ×2 IMPLANT
CLIP APPLIE 9.375 MED OPEN (MISCELLANEOUS) IMPLANT
COVER BACK TABLE 60X90IN (DRAPES) ×2 IMPLANT
COVER MAYO STAND STRL (DRAPES) ×2 IMPLANT
COVER PROBE W GEL 5X96 (DRAPES) ×2 IMPLANT
DECANTER SPIKE VIAL GLASS SM (MISCELLANEOUS) ×1 IMPLANT
DERMABOND ADVANCED (GAUZE/BANDAGES/DRESSINGS) ×1
DERMABOND ADVANCED .7 DNX12 (GAUZE/BANDAGES/DRESSINGS) ×1 IMPLANT
DEVICE DUBIN W/COMP PLATE 8390 (MISCELLANEOUS) ×2 IMPLANT
DRAPE LAPAROSCOPIC ABDOMINAL (DRAPES) IMPLANT
DRAPE LAPAROTOMY 100X72 PEDS (DRAPES) ×2 IMPLANT
DRAPE UTILITY XL STRL (DRAPES) ×2 IMPLANT
ELECT COATED BLADE 2.86 ST (ELECTRODE) ×2 IMPLANT
ELECT REM PT RETURN 9FT ADLT (ELECTROSURGICAL) ×2
ELECTRODE REM PT RTRN 9FT ADLT (ELECTROSURGICAL) ×1 IMPLANT
GLOVE BIOGEL PI IND STRL 7.0 (GLOVE) IMPLANT
GLOVE BIOGEL PI IND STRL 8 (GLOVE) ×1 IMPLANT
GLOVE BIOGEL PI INDICATOR 7.0 (GLOVE) ×2
GLOVE BIOGEL PI INDICATOR 8 (GLOVE) ×1
GLOVE ECLIPSE 6.5 STRL STRAW (GLOVE) ×1 IMPLANT
GLOVE ECLIPSE 8.0 STRL XLNG CF (GLOVE) ×2 IMPLANT
GOWN STRL REUS W/ TWL LRG LVL3 (GOWN DISPOSABLE) ×2 IMPLANT
GOWN STRL REUS W/TWL LRG LVL3 (GOWN DISPOSABLE) ×4
HEMOSTAT ARISTA ABSORB 3G PWDR (MISCELLANEOUS) IMPLANT
HEMOSTAT SNOW SURGICEL 2X4 (HEMOSTASIS) IMPLANT
KIT MARKER MARGIN INK (KITS) ×2 IMPLANT
NDL HYPO 25X1 1.5 SAFETY (NEEDLE) ×1 IMPLANT
NEEDLE HYPO 25X1 1.5 SAFETY (NEEDLE) ×2 IMPLANT
NS IRRIG 1000ML POUR BTL (IV SOLUTION) ×2 IMPLANT
PACK BASIN DAY SURGERY FS (CUSTOM PROCEDURE TRAY) ×2 IMPLANT
PENCIL BUTTON HOLSTER BLD 10FT (ELECTRODE) ×2 IMPLANT
SLEEVE SCD COMPRESS KNEE MED (MISCELLANEOUS) ×2 IMPLANT
SPONGE LAP 4X18 RFD (DISPOSABLE) ×2 IMPLANT
SUT MNCRL AB 4-0 PS2 18 (SUTURE) ×2 IMPLANT
SUT SILK 2 0 SH (SUTURE) IMPLANT
SUT VICRYL 3-0 CR8 SH (SUTURE) ×2 IMPLANT
SYR CONTROL 10ML LL (SYRINGE) ×2 IMPLANT
TOWEL GREEN STERILE FF (TOWEL DISPOSABLE) ×2 IMPLANT
TOWEL OR NON WOVEN STRL DISP B (DISPOSABLE) ×1 IMPLANT
TUBE CONNECTING 20X1/4 (TUBING) IMPLANT
YANKAUER SUCT BULB TIP NO VENT (SUCTIONS) IMPLANT

## 2018-05-20 NOTE — H&P (Signed)
Mallory Barnes  Location: [facility] Patient #: [id number] DOB: [birth year] Undefined / Language: Cleophus Molt / Race: White Female  History of Present Illness  Patient words: 57 yo female with screening detected mammogram revealed 10 mm and 9 mm area right breast UOQ calcifications. 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 She denies mass , discharge or other problem with her breast.  The patient is a 57 year old female.   Past Surgical History  No pertinent past surgical history  Diagnostic Studies History  Colonoscopy 1-5 years ago Mammogram within last year Pap Smear 1-5 years ago  Medication History  Medications Reconciled  Social History  Alcohol use Occasional alcohol use. Caffeine use Carbonated beverages, Tea. No drug use Tobacco use Never smoker.  Family History  Arthritis Sister. Diabetes Mellitus Father, Sister. Heart Disease Father. Hypertension Brother, Father. Melanoma Father. Prostate Cancer Father.  Pregnancy / Birth History  Age at menarche 32 years. Age of menopause 49-50 Gravida 0 Para 0 Regular periods  Other Problems  Asthma Gastroesophageal Reflux Disease Lump In Breast     Review of Systems  General Not Present- Appetite Loss, Chills, Fatigue, Fever, Night Sweats, Weight Gain and Weight Loss. Skin Not Present- Change in Wart/Mole, Dryness, Hives, Jaundice, New Lesions, Non-Healing Wounds, Rash and Ulcer. HEENT Present- Ringing in the Ears, Seasonal Allergies and Wears glasses/contact lenses. Not Present- Earache, Hearing Loss, Hoarseness, Nose Bleed, Oral Ulcers, Sinus Pain, Sore Throat, Visual Disturbances and Yellow Eyes. Respiratory Present- Difficulty Breathing. Not Present- Bloody sputum, Chronic Cough, Snoring and Wheezing. Breast Present- Breast Mass. Not Present- Breast Pain, Nipple Discharge and Skin Changes. Cardiovascular Present- Shortness of Breath. Not Present- Chest Pain,  Difficulty Breathing Lying Down, Leg Cramps, Palpitations, Rapid Heart Rate and Swelling of Extremities. Gastrointestinal Not Present- Abdominal Pain, Bloating, Bloody Stool, Change in Bowel Habits, Chronic diarrhea, Constipation, Difficulty Swallowing, Excessive gas, Gets full quickly at meals, Hemorrhoids, Indigestion, Nausea, Rectal Pain and Vomiting. Female Genitourinary Not Present- Frequency, Nocturia, Painful Urination, Pelvic Pain and Urgency. Neurological Not Present- Decreased Memory, Fainting, Headaches, Numbness, Seizures, Tingling, Tremor, Trouble walking and Weakness. Psychiatric Not Present- Anxiety, Bipolar, Change in Sleep Pattern, Depression, Fearful and Frequent crying. Endocrine Present- Hot flashes. Not Present- Cold Intolerance, Excessive Hunger, Hair Changes, Heat Intolerance and New Diabetes. Hematology Not Present- Blood Thinners, Easy Bruising, Excessive bleeding, Gland problems, HIV and Persistent Infections.   Physical Exam  General Mental Status-Alert. General Appearance-Consistent with stated age. Hydration-Well hydrated. Voice-Normal.  Head and Neck Head-normocephalic, atraumatic with no lesions or palpable masses.  Chest and Lung Exam Chest and lung exam reveals -quiet, even and easy respiratory effort with no use of accessory muscles and on auscultation, normal breath sounds, no adventitious sounds and normal vocal resonance. Inspection Chest Wall - Normal. Back - normal.  Cardiovascular Cardiovascular examination reveals -on palpation PMI is normal in location and amplitude, no palpable S3 or S4. Normal cardiac borders., normal heart sounds, regular rate and rhythm with no murmurs, carotid auscultation reveals no bruits and normal pedal pulses bilaterally.  Neurologic Neurologic evaluation reveals -alert and oriented x 3 with no impairment of recent or remote memory. Mental Status-Normal.  Musculoskeletal Normal Exam -  Left-Upper Extremity Strength Normal and Lower Extremity Strength Normal. Normal Exam - Right-Upper Extremity Strength Normal, Lower Extremity Weakness.    Assessment & Plan   BREAST NEOPLASM, TIS (DCIS), RIGHT (D05.11) Impression: Pt has opted for right breast lumpectomy Risk of lumpectomy include bleeding, infection, seroma, more surgery, use of  seed/wire, wound care, cosmetic deformity and the need for other treatments, death , blood clots, death. Pt agrees to proceed.  Current Plans You are being scheduled for surgery- Our schedulers will call you.  You should hear from our office's scheduling department within 5 working days about the location, date, and time of surgery. We try to make accommodations for patient's preferences in scheduling surgery, but sometimes the OR schedule or the surgeon's schedule prevents [place] from making those accommodations.  If you have not heard from our office [phone] in 5 working days, call the office and ask for your surgeon's nurse.  If you have other questions about your diagnosis, plan, or surgery, call the office and ask for your surgeon's nurse.  Pt Education - [facility] Breast Cancer Information Given - [facility] "Breast Journey" Package Pt Education - Pamphlet Given - Breast Biopsy: discussed with patient and provided information. We discussed the staging and pathophysiology of breast cancer. We discussed all of the different options for treatment for breast cancer including surgery, chemotherapy, radiation therapy, Herceptin, and antiestrogen therapy. We discussed a sentinel lymph node biopsy as she does not appear to having lymph node involvement right now. We discussed the performance of that with injection of radioactive tracer and blue dye. We discussed that she would have an incision underneath her axillary hairline. We discussed that there is a bout a 10-20% chance of having a positive node with a sentinel lymph node biopsy and we  will await the permanent pathology to make any other first further decisions in terms of her treatment. One of these options might be to return to the operating room to perform an axillary lymph node dissection. We discussed about a 1-2% risk lifetime of chronic shoulder pain as well as lymphedema associated with a sentinel lymph node biopsy. We discussed the options for treatment of the breast cancer which included lumpectomy versus a mastectomy. We discussed the performance of the lumpectomy with a wire placement. We discussed a 10-20% chance of a positive margin requiring reexcision in the operating room. We also discussed that she may need radiation therapy or antiestrogen therapy or both if she undergoes lumpectomy. We discussed the mastectomy and the postoperative care for that as well. We discussed that there is no difference in her survival whether she undergoes lumpectomy with radiation therapy or antiestrogen therapy versus a mastectomy. There is a slight difference in the local recurrence rate being 3-5% with lumpectomy and about 1% with a mastectomy. We discussed the risks of operation including bleeding, infection, possible reoperation. She understands her further therapy will be based on what her stages at the time of her operation.  Pt Education - [facility] breast cancer surgery: discussed with patient and provided information. Pt Education - ABC (After Breast Cancer) Class Info: discussed with patient and provided information.

## 2018-05-20 NOTE — Anesthesia Postprocedure Evaluation (Signed)
Anesthesia Post Note  Patient: Mallory Barnes  Procedure(s) Performed: BREAST LUMPECTOMY WITH RADIOACTIVE SEED LOCALIZATION (Right Breast)     Patient location during evaluation: PACU Anesthesia Type: General Level of consciousness: awake and alert Pain management: pain level controlled Vital Signs Assessment: post-procedure vital signs reviewed and stable Respiratory status: spontaneous breathing, nonlabored ventilation, respiratory function stable and patient connected to nasal cannula oxygen Cardiovascular status: blood pressure returned to baseline and stable Postop Assessment: no apparent nausea or vomiting Anesthetic complications: no    Last Vitals:  Vitals:   05/20/18 0944 05/20/18 0956  BP:  138/83  Pulse: 73 71  Resp: 17 18  Temp:  36.5 C  SpO2: 100% 100%    Last Pain:  Vitals:   05/20/18 0956  TempSrc:   PainSc: 2                  Tiajuana Amass

## 2018-05-20 NOTE — Interval H&P Note (Signed)
History and Physical Interval Note:  05/20/2018 8:15 AM  Mallory Barnes  has presented today for surgery, with the diagnosis of breast cancer  The various methods of treatment have been discussed with the patient and family. After consideration of risks, benefits and other options for treatment, the patient has consented to  Procedure(s): BREAST LUMPECTOMY WITH RADIOACTIVE SEED LOCALIZATION (Right) as a surgical intervention .  The patient's history has been reviewed, patient examined, no change in status, stable for surgery.  I have reviewed the patient's chart and labs.  Questions were answered to the patient's satisfaction.     [place]

## 2018-05-20 NOTE — Op Note (Signed)
Preoperative diagnosis: Right breast DCIS  Postoperative diagnosis: Same  Procedure: Right breast seed localized lumpectomy  Surgeon: Erroll Luna, MD  Anesthesia: LMA with local  EBL: 10 cc  Drains: None  Specimen: Right breast tissue with 2 seeds into's clips verified by Faxitron  IV fluids: Per anesthesia record  Indications for procedure: The patient is a 57 year old female with DCIS of her right breast.  She opted for breast conservation versus mastectomy.  Risk, benefits and other options of treatment and/or surgery discussed.  She agreed to proceed with right breast lumpectomy.The procedure has been discussed with the patient. Alternatives to surgery have been discussed with the patient.  Risks of surgery include bleeding,  Infection,  Seroma formation, death,  and the need for further surgery.   The patient understands and wishes to proceed.   Description of procedure: The patient was met in the holding area.  Neoprobe was used to verify seed location of the right breast and this was marked as the correct side.  Questions were answered.  She was taken back to the operating room.  She was placed supine upon the operating room table.  After induction of LMA anesthesia, the right breast was prepped and draped in sterile fashion.  Timeout was done.  She received preoperative antibiotics and her films were in the operating room available for review.  Neoprobe was used in both seeds were identified and marked in the right breast upper outer quadrant.  Curvilinear incision was made over this area.  All tissue around both seeds and both clips were excised with a grossly negative margin.  One specimen was sent and Faxitron image revealed both seeds in both clip to be in the specimen.  This was oriented with ink and sent to pathology.  The cavity was made hemostatic with cautery.  Irrigation used and suctioned out.  Cavity closed with 3-0 Vicryl.  4-0 Monocryl used to close skin.  All final  counts found to be correct.  The patient was awoke extubated taken to recovery in satisfactory condition.

## 2018-05-20 NOTE — Transfer of Care (Signed)
Immediate Anesthesia Transfer of Care Note  Patient: Mallory Barnes  Procedure(s) Performed: BREAST LUMPECTOMY WITH RADIOACTIVE SEED LOCALIZATION (Right Breast)  Patient Location: PACU  Anesthesia Type:General  Level of Consciousness: drowsy  Airway & Oxygen Therapy: Patient Spontanous Breathing and Patient connected to face mask oxygen  Post-op Assessment: Report given to RN and Post -op Vital signs reviewed and stable  Post vital signs: Reviewed and stable  Last Vitals:  Vitals Value Taken Time  BP    Temp    Pulse    Resp 23 05/20/2018  9:12 AM  SpO2    Vitals shown include unvalidated device data.  Last Pain:  Vitals:   05/20/18 0709  TempSrc: Oral  PainSc: 0-No pain         Complications: No apparent anesthesia complications

## 2018-05-20 NOTE — Anesthesia Preprocedure Evaluation (Signed)
Anesthesia Evaluation  Patient identified by MRN, date of birth, ID band Patient awake    Reviewed: Allergy & Precautions, NPO status , Patient's Chart, lab work & pertinent test results  Airway Mallampati: II  TM Distance: >3 FB Neck ROM: Full    Dental   Pulmonary asthma ,    breath sounds clear to auscultation       Cardiovascular negative cardio ROS   Rhythm:Regular Rate:Normal     Neuro/Psych negative neurological ROS     GI/Hepatic Neg liver ROS, GERD  ,  Endo/Other  negative endocrine ROS  Renal/GU negative Renal ROS     Musculoskeletal   Abdominal   Peds  Hematology negative hematology ROS (+)   Anesthesia Other Findings   Reproductive/Obstetrics                             Anesthesia Physical Anesthesia Plan  ASA: II  Anesthesia Plan: General   Post-op Pain Management:    Induction: Intravenous  PONV Risk Score and Plan: 3 and Dexamethasone, Ondansetron and Treatment may vary due to age or medical condition  Airway Management Planned: LMA  Additional Equipment:   Intra-op Plan:   Post-operative Plan: Extubation in OR  Informed Consent: I have reviewed the patients History and Physical, chart, labs and discussed the procedure including the risks, benefits and alternatives for the proposed anesthesia with the patient or authorized representative who has indicated his/her understanding and acceptance.   Dental advisory given  Plan Discussed with: CRNA  Anesthesia Plan Comments:         Anesthesia Quick Evaluation

## 2018-05-20 NOTE — Discharge Instructions (Signed)
[place] Office Phone Number [phone]  BREAST BIOPSY/ PARTIAL MASTECTOMY: POST OP INSTRUCTIONS  Always review your discharge instruction sheet given to you by the facility where your surgery was performed.  IF YOU HAVE DISABILITY OR FAMILY LEAVE FORMS, YOU MUST BRING THEM TO THE OFFICE FOR PROCESSING.  DO NOT GIVE THEM TO YOUR DOCTOR.  1. A prescription for pain medication may be given to you upon discharge.  Take your pain medication as prescribed, if needed.  If narcotic pain medicine is not needed, then you may take acetaminophen (Tylenol) or ibuprofen (Advil) as needed. 2. Take your usually prescribed medications unless otherwise directed 3. If you need a refill on your pain medication, please contact your pharmacy.  They will contact our office to request authorization.  Prescriptions will not be filled after 5pm or on week-ends. 4. You should eat very light the first 24 hours after surgery, such as soup, crackers, pudding, etc.  Resume your normal diet the day after surgery. 5. Most patients will experience some swelling and bruising in the breast.  Ice packs and a good support bra will help.  Swelling and bruising can take several days to resolve.  6. It is common to experience some constipation if taking pain medication after surgery.  Increasing fluid intake and taking a stool softener will usually help or prevent this problem from occurring.  A mild laxative (Milk of Magnesia or Miralax) should be taken according to package directions if there are no bowel movements after 48 hours. 7. Unless discharge instructions indicate otherwise, you may remove your bandages 24-48 hours after surgery, and you may shower at that time.  You may have steri-strips (small skin tapes) in place directly over the incision.  These strips should be left on the skin for 7-10 days.  If your surgeon used skin glue on the incision, you may shower in 24 hours.  The glue will flake off over the  next 2-3 weeks.  Any sutures or staples will be removed at the office during your follow-up visit. 8. ACTIVITIES:  You may resume regular daily activities (gradually increasing) beginning the next day.  Wearing a good support bra or sports bra minimizes pain and swelling.  You may have sexual intercourse when it is comfortable. a. You may drive when you no longer are taking prescription pain medication, you can comfortably wear a seatbelt, and you can safely maneuver your car and apply brakes. b. RETURN TO WORK:  ______________________________________________________________________________________ 9. You should see your doctor in the office for a follow-up appointment approximately two weeks after your surgery.  Your doctors nurse will typically make your follow-up appointment when she calls you with your pathology report.  Expect your pathology report 2-3 business days after your surgery.  You may call to check if you do not hear from [place] after three days. 10. OTHER INSTRUCTIONS: _______________________________________________________________________________________________ _____________________________________________________________________________________________________________________________________ _____________________________________________________________________________________________________________________________________ _____________________________________________________________________________________________________________________________________  WHEN TO CALL YOUR DOCTOR: 1. Fever over 101.0 2. Nausea and/or vomiting. 3. Extreme swelling or bruising. 4. Continued bleeding from incision. 5. Increased pain, redness, or drainage from the incision.  The clinic staff is available to answer your questions during regular business hours.  Please dont hesitate to call and ask to speak to one of the nurses for clinical concerns.  If you have a medical emergency, go to the nearest  emergency room or call 911.  A surgeon from [facility] is always on call at the hospital.  For further questions, please visit [email or web address]  Post Anesthesia Home Care Instructions  Activity: Get plenty of rest for the remainder of the day. A responsible individual must stay with you for 24 hours following the procedure.  For the next 24 hours, DO NOT: -Drive a car -Paediatric nurse -Drink alcoholic beverages -Take any medication unless instructed by your physician -Make any legal decisions or sign important papers.  Meals: Start with liquid foods such as gelatin or soup. Progress to regular foods as tolerated. Avoid greasy, spicy, heavy foods. If nausea and/or vomiting occur, drink only clear liquids until the nausea and/or vomiting subsides. Call your physician if vomiting continues.  Special Instructions/Symptoms: Your throat may feel dry or sore from the anesthesia or the breathing tube placed in your throat during surgery. If this causes discomfort, gargle with warm salt water. The discomfort should disappear within 24 hours.  If you had a scopolamine patch placed behind your ear for the management of post- operative nausea and/or vomiting:  1. The medication in the patch is effective for 72 hours, after which it should be removed.  Wrap patch in a tissue and discard in the trash. Wash hands thoroughly with soap and water. 2. You may remove the patch earlier than 72 hours if you experience unpleasant side effects which may include dry mouth, dizziness or visual disturbances. 3. Avoid touching the patch. Wash your hands with soap and water after contact with the patch.   Call your surgeon if you experience:   1.  Fever over 101.0. 2.  Inability to urinate. 3.  Nausea and/or vomiting. 4.  Extreme swelling or bruising at the surgical site. 5.  Continued bleeding from the incision. 6.  Increased pain, redness or drainage from the incision. 7.   Problems related to your pain medication. 8.  Any problems and/or concerns

## 2018-05-20 NOTE — Anesthesia Procedure Notes (Signed)
Procedure Name: LMA Insertion Date/Time: 05/20/2018 8:28 AM Performed by: Marrianne Mood, CRNA Pre-anesthesia Checklist: Patient identified, Emergency Drugs available, Suction available, Patient being monitored and Timeout performed Patient Re-evaluated:Patient Re-evaluated prior to induction Oxygen Delivery Method: Circle system utilized Preoxygenation: Pre-oxygenation with 100% oxygen Induction Type: IV induction Ventilation: Mask ventilation without difficulty LMA: LMA inserted LMA Size: 4.0 Number of attempts: 1 Airway Equipment and Method: Bite block Placement Confirmation: positive ETCO2 Tube secured with: Tape Dental Injury: Teeth and Oropharynx as per pre-operative assessment

## 2018-05-21 ENCOUNTER — Encounter: Payer: Self-pay | Admitting: Hematology and Oncology

## 2018-05-21 ENCOUNTER — Encounter (HOSPITAL_BASED_OUTPATIENT_CLINIC_OR_DEPARTMENT_OTHER): Payer: Self-pay | Admitting: Surgery

## 2018-05-22 ENCOUNTER — Telehealth: Payer: Self-pay

## 2018-05-22 NOTE — Telephone Encounter (Signed)
Received an my chart email from pt - see below:  ----- Message from Jerome, Generic sent at 05/21/2018 10:53 AM EDT -----    Just an update for my medical profile. On the preventative page of MyChart you do not have records for tetanus shot or PAP smear. I received a PAP smear at my most recent physical. Performed by Dr. Osborne Casco on 03/02/18. I received a tetanus shot from Dr. Osborne Casco on 03/10/18 when I was seen by him for a dog bite. Just wanted to share this information with you.     Mallory Barnes   Updated Epic for patient.

## 2018-05-28 NOTE — Progress Notes (Signed)
Location of Breast Cancer: Right Breast  Histology per Pathology Report:  03/27/18 Diagnosis 1. Breast, right, needle core biopsy, UOQ - DUCTAL CARCINOMA IN SITU WITH CALCIFICATIONS AND NECROSIS, SEE COMMENT. 2. Breast, right, needle core biopsy, UOQ - DUCTAL CARCINOMA IN SITU WITH CALCIFICATIONS AND NECROSIS, SEE COMMENT.  Receptor Status: ER(NEG), PR (NEG)  05/20/18 Diagnosis Breast, lumpectomy, Right w/seed - DUCTAL CARCINOMA IN SITU, HIGH NUCLEAR GRADE WITH COMEDO NECROSIS, 1.6 CM - NEGATIVE FOR INVASIVE CARCINOMA. - CARCINOMA IS 1 MM FROM THE SUPERIOR RESECTION MARGIN; ALL OTHER RESECTION MARGINS ARE MORE THAN 0.5 CM FROM CARCINOMA. - BIOPSY SITE CHANGES. - BACKGROUND BREAST PARENCHYMA WITH FIBROCYSTIC CHANGE  Did patient present with symptoms or was this found on screening mammography?: It was discovered on a screening mammogram.   Past/Anticipated interventions by surgeon, if any: 05/20/18 Procedure: Right breast seed localized lumpectomy Surgeon: Erroll Luna, MD   Past/Anticipated interventions by medical oncology, if any:  06/02/18 Dr. Lindi Adie ASSESSMENT & PLAN:  Ductal carcinoma in situ (DCIS) of right breast 05/16/2018:Right lumpectomy: DCIS high nuclear grade with comedonecrosis, 1.6 cm, margins negative, ER 0%, PR 0%, Tis NX stage 0 Pathology counseling: I discussed the final pathology report of the patient provided  a copy of this report. We also discussed the final staging along with previously performed ER/PR  testing. Recommendation: Adjuvant radiation therapy followed by surveillance. Patient would like to do his surveillance with the surgeon.  We will see the patient on an as-needed basis. No orders of the defined types were placed in this encounter. The patient has a good understanding of the overall plan. she agrees with it. she will call with any problems that may develop before the next visit here.   Lymphedema issues, if any:  She denies. She  reports good arm mobility.   Pain issues, if any:  She denies.   SAFETY ISSUES:  Prior radiation? No  Pacemaker/ICD? No  Possible current pregnancy? No  Is the patient on methotrexate? No  Current Complaints / other details:    BP (!) 150/83   Pulse 79   Temp 98.2 F (36.8 C)   Resp 18   Ht 5\' 2"  (1.575 m)   Wt 132 lb 9.6 oz (60.1 kg)   LMP 02/06/2012   SpO2 98% Comment: room air  BMI 24.25 kg/m    Wt Readings from Last 3 Encounters:  06/02/18 132 lb 9.6 oz (60.1 kg)  06/02/18 132 lb 9.6 oz (60.1 kg)  06/02/18 133 lb 1.6 oz (60.4 kg)      Psalms Olarte, Stephani Police, RN 05/28/2018,10:34 AM

## 2018-06-02 ENCOUNTER — Encounter: Payer: Self-pay | Admitting: Radiation Oncology

## 2018-06-02 ENCOUNTER — Ambulatory Visit
Admission: RE | Admit: 2018-06-02 | Discharge: 2018-06-02 | Disposition: A | Discharge status: Home / Self Care | Payer: BLUE CROSS/BLUE SHIELD | Source: Ambulatory Visit | Attending: Radiation Oncology | Admitting: Radiation Oncology

## 2018-06-02 ENCOUNTER — Telehealth: Payer: Self-pay | Admitting: Hematology and Oncology

## 2018-06-02 ENCOUNTER — Inpatient Hospital Stay: Payer: BLUE CROSS/BLUE SHIELD | Attending: Hematology and Oncology | Admitting: Hematology and Oncology

## 2018-06-02 ENCOUNTER — Other Ambulatory Visit: Payer: Self-pay

## 2018-06-02 VITALS — BP 150/83 | HR 79 | Temp 98.2°F | Resp 18 | Ht 62.0 in | Wt 132.6 lb

## 2018-06-02 VITALS — BP 144/75 | HR 74 | Temp 98.2°F | Resp 18 | Ht 62.0 in | Wt 132.6 lb

## 2018-06-02 DIAGNOSIS — Z171 Estrogen receptor negative status [ER-]: Secondary | ICD-10-CM | POA: Insufficient documentation

## 2018-06-02 DIAGNOSIS — Z79899 Other long term (current) drug therapy: Secondary | ICD-10-CM | POA: Diagnosis not present

## 2018-06-02 DIAGNOSIS — Z170 Estrogen receptor positive status [ER+]: Secondary | ICD-10-CM

## 2018-06-02 DIAGNOSIS — D0511 Intraductal carcinoma in situ of right breast: Secondary | ICD-10-CM

## 2018-06-02 DIAGNOSIS — Z9889 Other specified postprocedural states: Secondary | ICD-10-CM | POA: Diagnosis not present

## 2018-06-02 NOTE — Progress Notes (Signed)
Patient Care Team: Tisovec, Fransico Him, MD as PCP - General (Internal Medicine) Erroll Luna, MD as Consulting Physician (General Surgery) Nicholas Lose, MD as Consulting Physician (Hematology and Oncology) Eppie Gibson, MD as Attending Physician (Radiation Oncology)  DIAGNOSIS:  Encounter Diagnosis  Name Primary?  . Ductal carcinoma in situ (DCIS) of right breast     SUMMARY OF ONCOLOGIC HISTORY:   Ductal carcinoma in situ (DCIS) of right breast   03/27/2018 Initial Diagnosis    Screening detected right breast calcifications in 2 areas anteriorly 1 cm size and posteriorly 0.9 cm in size, biopsy of both of these came back as high-grade DCIS with calcifications and necrosis ER 0%, PR 0%, Tis NX stage 0      05/03/2018 Miscellaneous    Genetic testing: No mutations identified      05/20/2018 Surgery    Right lumpectomy: DCIS high nuclear grade with comedonecrosis, 1.6 cm, margins negative, ER 0%, PR 0%, Tis NX stage 0       CHIEF COMPLIANT: Follow-up after recent right lumpectomy  INTERVAL HISTORY: Mallory Barnes is a 40-year with above-mentioned history of right breast DCIS who underwent lumpectomy and is here today to discuss pathology report.  She is healing recovering very well from the recent surgery.  REVIEW OF SYSTEMS:   Constitutional: Denies fevers, chills or abnormal weight loss Eyes: Denies blurriness of vision Ears, nose, mouth, throat, and face: Denies mucositis or sore throat Respiratory: Denies cough, dyspnea or wheezes Cardiovascular: Denies palpitation, chest discomfort Gastrointestinal:  Denies nausea, heartburn or change in bowel habits Skin: Denies abnormal skin rashes Lymphatics: Denies new lymphadenopathy or easy bruising Neurological:Denies numbness, tingling or new weaknesses Behavioral/Psych: Mood is stable, no new changes  Extremities: No lower extremity edema Breast: Recent right lumpectomy All other systems were reviewed with the patient  and are negative.  I have reviewed the past medical history, past surgical history, social history and family history with the patient and they are unchanged from previous note.  ALLERGIES:  is allergic to doxycycline; ketek [telithromycin]; and penicillins.  MEDICATIONS:  Current Outpatient Medications  Medication Sig Dispense Refill  . albuterol (PROVENTIL HFA;VENTOLIN HFA) 108 (90 BASE) MCG/ACT inhaler Inhale 2 puffs into the lungs. 2 puffs pre exercise prn    . BREO ELLIPTA 100-25 MCG/INH AEPB   11  . diphenhydrAMINE (BENADRYL) 25 MG tablet Take 25 mg by mouth every 6 (six) hours as needed.    Marland Kitchen GLUCOSAMINE-CHONDROITIN-MSM PO Take by mouth 2 (two) times daily.    Marland Kitchen ibuprofen (ADVIL,MOTRIN) 200 MG tablet Take 200 mg by mouth. 1 tab twice daily    . ibuprofen (ADVIL,MOTRIN) 800 MG tablet Take 1 tablet (800 mg total) by mouth every 8 (eight) hours as needed. 30 tablet 0  . milk thistle 175 MG tablet Take 175 mg by mouth daily. Per patient, 1000 mg daily    . montelukast (SINGULAIR) 10 MG tablet   2  . MULTIPLE VITAMIN PO Take by mouth.    Marland Kitchen OVER THE COUNTER MEDICATION Blach Cohosh 540 mg daily.    Marland Kitchen OVER THE COUNTER MEDICATION Afrin pump mist only as needed for nasal congestion    . oxyCODONE (OXY IR/ROXICODONE) 5 MG immediate release tablet Take 1 tablet (5 mg total) by mouth every 6 (six) hours as needed for severe pain. 10 tablet 0  . pantoprazole (PROTONIX) 40 MG tablet Take 40 mg by mouth daily.     No current facility-administered medications for this visit.  PHYSICAL EXAMINATION: ECOG PERFORMANCE STATUS: 1 - Symptomatic but completely ambulatory  Vitals:   06/02/18 0920  BP: (!) 142/79  Pulse: 75  Resp: 19  Temp: 98.3 F (36.8 C)  SpO2: 99%   Filed Weights   06/02/18 0920  Weight: 133 lb 1.6 oz (60.4 kg)    GENERAL:alert, no distress and comfortable SKIN: skin color, texture, turgor are normal, no rashes or significant lesions EYES: normal, Conjunctiva are pink  and non-injected, sclera clear OROPHARYNX:no exudate, no erythema and lips, buccal mucosa, and tongue normal  NECK: supple, thyroid normal size, non-tender, without nodularity LYMPH:  no palpable lymphadenopathy in the cervical, axillary or inguinal LUNGS: clear to auscultation and percussion with normal breathing effort HEART: regular rate & rhythm and no murmurs and no lower extremity edema ABDOMEN:abdomen soft, non-tender and normal bowel sounds MUSCULOSKELETAL:no cyanosis of digits and no clubbing  NEURO: alert & oriented x 3 with fluent speech, no focal motor/sensory deficits EXTREMITIES: No lower extremity edema   LABORATORY DATA:  I have reviewed the data as listed CMP Latest Ref Rng & Units 04/08/2018  Glucose 70 - 140 mg/dL 112  BUN 7 - 26 mg/dL 26  Creatinine 0.60 - 1.10 mg/dL 1.41(H)  Sodium 136 - 145 mmol/L 141  Potassium 3.5 - 5.1 mmol/L 4.2  Chloride 98 - 109 mmol/L 104  CO2 22 - 29 mmol/L 26  Calcium 8.4 - 10.4 mg/dL 9.9  Total Protein 6.4 - 8.3 g/dL 7.0  Total Bilirubin 0.2 - 1.2 mg/dL 0.5  Alkaline Phos 40 - 150 U/L 86  AST 5 - 34 U/L 24  ALT 0 - 55 U/L 17    Lab Results  Component Value Date   WBC 4.9 04/08/2018   HGB 13.3 04/08/2018   HCT 39.9 04/08/2018   MCV 89.3 04/08/2018   PLT 254 04/08/2018   NEUTROABS 3.0 04/08/2018    ASSESSMENT & PLAN:  Ductal carcinoma in situ (DCIS) of right breast 05/16/2018:Right lumpectomy: DCIS high nuclear grade with comedonecrosis, 1.6 cm, margins negative, ER 0%, PR 0%, Tis NX stage 0  Pathology counseling: I discussed the final pathology report of the patient provided  a copy of this report. We also discussed the final staging along with previously performed ER/PR  testing.  Recommendation: Adjuvant radiation therapy followed by surveillance.  Patient would like to do his surveillance with the surgeon.  We will see the patient on an as-needed basis.   No orders of the defined types were placed in this  encounter.  The patient has a good understanding of the overall plan. she agrees with it. she will call with any problems that may develop before the next visit here.   Harriette Ohara, MD 06/02/18

## 2018-06-02 NOTE — Telephone Encounter (Signed)
Per 7/30 los, no new orders or referrals.

## 2018-06-02 NOTE — Progress Notes (Signed)
Radiation Oncology         [phone]) [phone] ________________________________  Name: Mallory Barnes MRN: [id number]  Date: 06/02/2018  DOB: [birth year]  Follow-Up Visit Note  Outpatient  CC: Tisovec, Fransico Him, MD  Nicholas Lose, MD  Diagnosis:      ICD-10-CM   1. Ductal carcinoma in situ (DCIS) of right breast D05.11     Stage 0 Right Breast UOQ DCIS, ER-, PR-, High Grade  CHIEF COMPLAINT: Here to discuss management of right DCIS  Narrative:  The patient returns today for follow-up. 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  Symptomatically, the patient reports: she is healing well She went to the [facility] before lumpectomy         ALLERGIES:  is allergic to doxycycline; ketek [telithromycin]; and penicillins.  Meds: Current Outpatient Medications  Medication Sig Dispense Refill  . albuterol (PROVENTIL HFA;VENTOLIN HFA) 108 (90 BASE) MCG/ACT inhaler Inhale 2 puffs into the lungs. 2 puffs pre exercise prn    . BREO ELLIPTA 100-25 MCG/INH AEPB   11  . diphenhydrAMINE (BENADRYL) 25 MG tablet Take 25 mg by mouth every 6 (six) hours as needed.    Marland Kitchen GLUCOSAMINE-CHONDROITIN-MSM PO Take by mouth 2 (two) times daily.    Marland Kitchen ibuprofen (ADVIL,MOTRIN) 200 MG tablet Take 200 mg by mouth. 1 tab twice daily    . milk thistle 175 MG tablet Take 175 mg by mouth daily. Per patient, 1000 mg daily    . montelukast (SINGULAIR) 10 MG tablet   2  . MULTIPLE VITAMIN PO Take by mouth.    Marland Kitchen OVER THE COUNTER MEDICATION Blach Cohosh 540 mg daily.    . pantoprazole (PROTONIX) 40 MG tablet Take 40 mg by mouth daily.    Marland Kitchen OVER THE COUNTER  MEDICATION Afrin pump mist only as needed for nasal congestion    . oxyCODONE (OXY IR/ROXICODONE) 5 MG immediate release tablet Take 1 tablet (5 mg total) by mouth every 6 (six) hours as needed for severe pain. (Patient not taking: Reported on 06/02/2018) 10 tablet 0   No current facility-administered medications for this encounter.     Physical Findings:  height is '5\' 2"'$  (1.575 m) and weight is 132 lb 9.6 oz (60.1 kg). Her temperature is 98.2 F (36.8 C). Her blood pressure is 150/83 (abnormal) and her pulse is 79. Her respiration is 18 and oxygen saturation is 98%. .    General: Alert and oriented, in no acute distress Breast exam reveals lateral right breast lumpectomy scar is healing well. There is slight concavity over the lumpectomy bed.  Lab Findings: Lab Results  Component Value Date   WBC 4.9 04/08/2018   HGB 13.3 04/08/2018   HCT 39.9 04/08/2018   MCV 89.3 04/08/2018   PLT 254 04/08/2018    Radiographic Findings: Mm Breast Surgical Specimen  Result Date: 05/20/2018 CLINICAL DATA:  Status post lumpectomy today for 2 sites of high-grade DCIS within the RIGHT breast after earlier radioactive seed localizations. EXAM: SPECIMEN RADIOGRAPH OF THE RIGHT BREAST COMPARISON:  Previous exam(s). FINDINGS: Status post excision of the RIGHT breast. The radioactive seeds and biopsy  marker clips are present, completely intact, and were marked for pathology. The positions of the radioactive seeds and biopsy marker clips within the specimen were discussed with the OR staff during the procedure. IMPRESSION: Specimen radiograph of the right breast. Electronically Signed   By: Franki Cabot M.D.   On: 05/20/2018 08:58   Mm Rt Radioactive Seed Loc Mammo Guide  Result Date: 05/19/2018 CLINICAL DATA:  Recently diagnosed high-grade ductal carcinoma in situ with calcifications and necrosis at two nearby sites in the upper-outer quadrant of the right breast. EXAM: MAMMOGRAPHIC GUIDED RADIOACTIVE SEED  LOCALIZATION OF THE RIGHT BREAST X 2 COMPARISON:  Previous exam(s). FINDINGS: Patient presents for radioactive seed localization prior to right lumpectomy. I met with the patient and we discussed the procedure of seed localization including benefits and alternatives. We discussed the high likelihood of a successful procedure. We discussed the risks of the procedure including infection, bleeding, tissue injury and further surgery. We discussed the low dose of radioactivity involved in the procedure. Informed, written consent was given. The usual time-out protocol was performed immediately prior to the procedures. SITE #1: X-SHAPED CLIP UOQ RIGHT BREAST Using mammographic guidance, sterile technique, 1% lidocaine and an I-125 radioactive seed, the X shaped biopsy marker clip in the upper-outer quadrant of the right breast was localized using a lateral approach. The follow-up mammogram images confirm the seed in the expected location and were marked for Dr. Brantley Stage. Follow-up survey of the patient confirms presence of the radioactive seed. Order number of I-125 seed:  [id number]. Total activity:  0.252 mCi reference Date: 05/08/2018 SITE #2: COIL SHAPED CLIP UOQ RIGHT BREAST Using mammographic guidance, sterile technique, 1% lidocaine and an I-125 radioactive seed, the coil shaped biopsy marker clip in the upper-outer quadrant of the right breast was localized using a lateral approach. The follow-up mammogram images confirm the seed in the expected location and were marked for Dr. Brantley Stage. Follow-up survey of the patient confirms presence of the radioactive seed. Order number of I-125 seed:  [id number]. Total activity:  0.251 mCi reference Date: 05/08/2018 The patient tolerated the procedures well and was released from the [facility]. She was given instructions regarding seed removal. IMPRESSION: Radioactive seed localization right breast X 2. No apparent complications. Electronically Signed   By: Claudie Revering M.D.    On: 05/19/2018 15:38   Mm Rt Radio Seed Ea Add Lesion Loc Mammo  Result Date: 05/19/2018 CLINICAL DATA:  Recently diagnosed high-grade ductal carcinoma in situ with calcifications and necrosis at two nearby sites in the upper-outer quadrant of the right breast. EXAM: MAMMOGRAPHIC GUIDED RADIOACTIVE SEED LOCALIZATION OF THE RIGHT BREAST X 2 COMPARISON:  Previous exam(s). FINDINGS: Patient presents for radioactive seed localization prior to right lumpectomy. I met with the patient and we discussed the procedure of seed localization including benefits and alternatives. We discussed the high likelihood of a successful procedure. We discussed the risks of the procedure including infection, bleeding, tissue injury and further surgery. We discussed the low dose of radioactivity involved in the procedure. Informed, written consent was given. The usual time-out protocol was performed immediately prior to the procedures. SITE #1: X-SHAPED CLIP UOQ RIGHT BREAST Using mammographic guidance, sterile technique, 1% lidocaine and an I-125 radioactive seed, the X shaped biopsy marker clip in the upper-outer quadrant of the right breast was localized using a lateral approach. The follow-up mammogram images confirm the seed in the expected location and were marked for Dr. Brantley Stage. Follow-up survey of the patient confirms presence of  the radioactive seed. Order number of I-125 seed:  [id number]. Total activity:  0.252 mCi reference Date: 05/08/2018 SITE #2: COIL SHAPED CLIP UOQ RIGHT BREAST Using mammographic guidance, sterile technique, 1% lidocaine and an I-125 radioactive seed, the coil shaped biopsy marker clip in the upper-outer quadrant of the right breast was localized using a lateral approach. The follow-up mammogram images confirm the seed in the expected location and were marked for Dr. Brantley Stage. Follow-up survey of the patient confirms presence of the radioactive seed. Order number of I-125 seed:  [id number]. Total  activity:  0.251 mCi reference Date: 05/08/2018 The patient tolerated the procedures well and was released from the [facility]. She was given instructions regarding seed removal. IMPRESSION: Radioactive seed localization right breast X 2. No apparent complications. Electronically Signed   By: Claudie Revering M.D.   On: 05/19/2018 15:38    Impression/Plan: Right Breast DCIS, ER-  We will give her a couple more weeks to heal, and then proceed with simulation.  We discussed adjuvant radiotherapy today.  I recommend radiotherapy to the right breast in order to reduce locoregional recurrence by half.  The risks, benefits and side effects of this treatment were discussed in detail.  She understands that radiotherapy is associated with skin irritation and fatigue in the acute setting. Late effects can include cosmetic changes and rare injury to internal organs.   She is enthusiastic about proceeding with treatment. A consent form has been signed and placed in her chart.  A total of 3 medically necessary complex treatment devices will be fabricated and supervised by me: 2 fields with MLCs for custom blocks to protect heart, and lungs;  and, a Vac-lok. MORE COMPLEX DEVICES MAY BE MADE IN DOSIMETRY FOR FIELD IN FIELD BEAMS FOR DOSE HOMOGENEITY.  I have requested : 3D Simulation which is medically necessary to give adequate dose to at risk tissues while sparing lungs and heart.  I have requested a DVH of the following structures: lungs, heart, right lumpectomy cavity.    The patient will receive 40.05 Gy in 15 fractions to the right breast with 2 fields.  This will be followed by a boost.  I spent at least 25 minutes face to face with the patient and more than 50% of that time was spent in counseling and/or coordination of care. _____________________________________   Eppie Gibson, MD  This document serves as a record of services personally performed by Eppie Gibson, MD. It was created on his behalf by Wilburn Mylar, a trained medical scribe. The creation of this record is based on the scribe's personal observations and the provider's statements to them. This document has been checked and approved by the attending provider.

## 2018-06-02 NOTE — Assessment & Plan Note (Signed)
05/16/2018:Right lumpectomy: DCIS high nuclear grade with comedonecrosis, 1.6 cm, margins negative, ER 0%, PR 0%, Tis NX stage 0  Pathology counseling: I discussed the final pathology report of the patient provided  a copy of this report. We also discussed the final staging along with previously performed ER/PR  testing.  Recommendation: Adjuvant radiation therapy followed by surveillance.  Patient would like to do his surveillance with the surgeon.  We will see the patient on an as-needed basis.

## 2018-06-07 ENCOUNTER — Encounter: Payer: Self-pay | Admitting: Hematology and Oncology

## 2018-06-12 ENCOUNTER — Telehealth: Payer: Self-pay | Admitting: Hematology and Oncology

## 2018-06-12 NOTE — Telephone Encounter (Signed)
Mailed patient calendar of upcoming sept appts per 8/7 sch message

## 2018-06-15 ENCOUNTER — Encounter: Payer: Self-pay | Admitting: Hematology and Oncology

## 2018-06-16 ENCOUNTER — Ambulatory Visit
Admission: RE | Admit: 2018-06-16 | Discharge: 2018-06-16 | Disposition: A | Discharge status: Home / Self Care | Payer: BLUE CROSS/BLUE SHIELD | Source: Ambulatory Visit | Attending: Radiation Oncology | Admitting: Radiation Oncology

## 2018-06-16 DIAGNOSIS — D0511 Intraductal carcinoma in situ of right breast: Secondary | ICD-10-CM | POA: Diagnosis not present

## 2018-06-16 DIAGNOSIS — Z510 Encounter for antineoplastic radiation therapy: Secondary | ICD-10-CM | POA: Diagnosis not present

## 2018-06-16 NOTE — Progress Notes (Signed)
  Radiation Oncology         ([phone]) [phone] ________________________________  Name: Mallory Barnes MRN: [id number]  Date: 06/16/2018  DOB: [birth year]  SIMULATION AND TREATMENT PLANNING NOTE    Outpatient  DIAGNOSIS:     ICD-10-CM   1. Ductal carcinoma in situ (DCIS) of right breast D05.11     NARRATIVE:  The patient was brought to the [facility].  Identity was confirmed.  All relevant records and images related to the planned course of therapy were reviewed.  The patient freely provided informed written consent to proceed with treatment after reviewing the details related to the planned course of therapy. The consent form was witnessed and verified by the simulation staff.    Then, the patient was set-up in a stable reproducible supine position for radiation therapy with her ipsilateral arm over her head, and her upper body secured in a custom-made Vac-lok device.  CT images were obtained.  Surface markings were placed.  The CT images were loaded into the planning software.    TREATMENT PLANNING NOTE: Treatment planning then occurred.  The radiation prescription was entered and confirmed.     A total of 3 medically necessary complex treatment devices were fabricated and supervised by me: 2 fields with MLCs for custom blocks to protect heart, and lungs;  and, a Vac-lok. MORE COMPLEX DEVICES MAY BE MADE IN DOSIMETRY FOR FIELD IN FIELD BEAMS FOR DOSE HOMOGENEITY.  I have requested : 3D Simulation which is medically necessary to give adequate dose to at risk tissues while sparing lungs and heart.  I have requested a DVH of the following structures: lungs, heart, lumpectomy cavity.    The patient will receive 40.05 Gy in 15 fractions to the right breast with 2 tangential fields.  This will be followed by a boost.  Optical Surface Tracking Plan:  Since intensity modulated radiotherapy (IMRT) and 3D conformal radiation treatment methods are predicated on accurate and precise  positioning for treatment, intrafraction motion monitoring is medically necessary to ensure accurate and safe treatment delivery. The ability to quantify intrafraction motion without excessive ionizing radiation dose can only be performed with optical surface tracking. Accordingly, surface imaging offers the opportunity to obtain 3D measurements of patient position throughout IMRT and 3D treatments without excessive radiation exposure. I am ordering optical surface tracking for this patient's upcoming course of radiotherapy.  ________________________________   Reference:  Ursula Alert, J, et al. Surface imaging-based analysis of intrafraction motion for breast radiotherapy patients.Journal of [employer], n. 6, nov. 2014. ISSN [id number].  Available at: <http://www.jacmp.org/index.php/jacmp/article/view/4957>.    -----------------------------------  Eppie Gibson, MD

## 2018-06-17 DIAGNOSIS — D0511 Intraductal carcinoma in situ of right breast: Secondary | ICD-10-CM | POA: Diagnosis not present

## 2018-06-17 DIAGNOSIS — Z510 Encounter for antineoplastic radiation therapy: Secondary | ICD-10-CM | POA: Diagnosis not present

## 2018-06-21 ENCOUNTER — Ambulatory Visit
Admission: RE | Admit: 2018-06-21 | Payer: BLUE CROSS/BLUE SHIELD | Source: Ambulatory Visit | Admitting: Radiation Oncology

## 2018-06-22 ENCOUNTER — Other Ambulatory Visit: Payer: Self-pay | Admitting: Radiation Oncology

## 2018-06-22 ENCOUNTER — Ambulatory Visit
Admission: RE | Admit: 2018-06-22 | Discharge: 2018-06-22 | Disposition: A | Discharge status: Home / Self Care | Payer: BLUE CROSS/BLUE SHIELD | Source: Ambulatory Visit | Attending: Radiation Oncology | Admitting: Radiation Oncology

## 2018-06-22 DIAGNOSIS — D0511 Intraductal carcinoma in situ of right breast: Secondary | ICD-10-CM | POA: Diagnosis not present

## 2018-06-22 DIAGNOSIS — N2889 Other specified disorders of kidney and ureter: Secondary | ICD-10-CM

## 2018-06-22 DIAGNOSIS — Z510 Encounter for antineoplastic radiation therapy: Secondary | ICD-10-CM | POA: Diagnosis not present

## 2018-06-22 MED ORDER — RADIAPLEXRX EX GEL
Freq: Once | CUTANEOUS | Status: AC
  Administered 2018-06-22: 16:00:00 via TOPICAL

## 2018-06-22 MED ORDER — ALRA NON-METALLIC DEODORANT (RAD-ONC)
1.0000 "application " | Freq: Once | TOPICAL | Status: AC
  Administered 2018-06-22: 1 via TOPICAL

## 2018-06-22 NOTE — Progress Notes (Signed)
error 

## 2018-06-23 ENCOUNTER — Ambulatory Visit: Payer: BLUE CROSS/BLUE SHIELD

## 2018-06-23 ENCOUNTER — Telehealth: Payer: Self-pay | Admitting: *Deleted

## 2018-06-23 ENCOUNTER — Ambulatory Visit
Admission: RE | Admit: 2018-06-23 | Discharge: 2018-06-23 | Disposition: A | Discharge status: Home / Self Care | Payer: BLUE CROSS/BLUE SHIELD | Source: Ambulatory Visit | Attending: Radiation Oncology | Admitting: Radiation Oncology

## 2018-06-23 DIAGNOSIS — D0511 Intraductal carcinoma in situ of right breast: Secondary | ICD-10-CM | POA: Diagnosis not present

## 2018-06-23 DIAGNOSIS — Z510 Encounter for antineoplastic radiation therapy: Secondary | ICD-10-CM | POA: Diagnosis not present

## 2018-06-23 NOTE — Telephone Encounter (Signed)
CALLED PATIENT TO INFORM OF STAT LABS ON 06-26-18 - 12:45 PM @ [facility] AND HER CT ON 06-26-18 - ARRIVAL TIME- 1:45 PM @ [facility], PT. TO BE NPO- 4 HRS. PRIOR TO TEST, PATEINT TO PICK-UP CONTRAST ON 06-25-18 @ RADIOLOGY, SPOKE WITH PATIENT AND SHE IS AWARE OF THESE APPTS.

## 2018-06-24 ENCOUNTER — Ambulatory Visit
Admission: RE | Admit: 2018-06-24 | Discharge: 2018-06-24 | Disposition: A | Discharge status: Home / Self Care | Payer: BLUE CROSS/BLUE SHIELD | Source: Ambulatory Visit | Attending: Radiation Oncology | Admitting: Radiation Oncology

## 2018-06-24 DIAGNOSIS — D0511 Intraductal carcinoma in situ of right breast: Secondary | ICD-10-CM | POA: Diagnosis not present

## 2018-06-24 DIAGNOSIS — Z510 Encounter for antineoplastic radiation therapy: Secondary | ICD-10-CM | POA: Diagnosis not present

## 2018-06-25 ENCOUNTER — Ambulatory Visit
Admission: RE | Admit: 2018-06-25 | Discharge: 2018-06-25 | Disposition: A | Discharge status: Home / Self Care | Payer: BLUE CROSS/BLUE SHIELD | Source: Ambulatory Visit | Attending: Radiation Oncology | Admitting: Radiation Oncology

## 2018-06-25 DIAGNOSIS — D0511 Intraductal carcinoma in situ of right breast: Secondary | ICD-10-CM | POA: Diagnosis not present

## 2018-06-25 DIAGNOSIS — Z510 Encounter for antineoplastic radiation therapy: Secondary | ICD-10-CM | POA: Diagnosis not present

## 2018-06-26 ENCOUNTER — Ambulatory Visit (HOSPITAL_COMMUNITY)
Admission: RE | Admit: 2018-06-26 | Discharge: 2018-06-26 | Disposition: A | Discharge status: Home / Self Care | Payer: BLUE CROSS/BLUE SHIELD | Source: Ambulatory Visit | Attending: Radiation Oncology | Admitting: Radiation Oncology

## 2018-06-26 ENCOUNTER — Ambulatory Visit
Admission: RE | Admit: 2018-06-26 | Discharge: 2018-06-26 | Disposition: A | Discharge status: Home / Self Care | Payer: BLUE CROSS/BLUE SHIELD | Source: Ambulatory Visit | Attending: Radiation Oncology | Admitting: Radiation Oncology

## 2018-06-26 DIAGNOSIS — N134 Hydroureter: Secondary | ICD-10-CM | POA: Diagnosis not present

## 2018-06-26 DIAGNOSIS — N2889 Other specified disorders of kidney and ureter: Secondary | ICD-10-CM | POA: Diagnosis not present

## 2018-06-26 DIAGNOSIS — Z510 Encounter for antineoplastic radiation therapy: Secondary | ICD-10-CM | POA: Diagnosis not present

## 2018-06-26 DIAGNOSIS — N200 Calculus of kidney: Secondary | ICD-10-CM | POA: Diagnosis not present

## 2018-06-26 DIAGNOSIS — N1330 Unspecified hydronephrosis: Secondary | ICD-10-CM | POA: Diagnosis not present

## 2018-06-26 DIAGNOSIS — D0511 Intraductal carcinoma in situ of right breast: Secondary | ICD-10-CM | POA: Diagnosis not present

## 2018-06-26 DIAGNOSIS — N132 Hydronephrosis with renal and ureteral calculous obstruction: Secondary | ICD-10-CM | POA: Diagnosis not present

## 2018-06-26 LAB — BUN & CREATININE (CHCC)
BUN: 24 mg/dL — ABNORMAL HIGH (ref 6–20)
Creatinine: 1.1 mg/dL — ABNORMAL HIGH (ref 0.44–1.00)
GFR, Est AFR Am: >60 mL/min (ref >60)
GFR, Estimated: 55 mL/min — ABNORMAL LOW (ref >60)

## 2018-06-26 MED ORDER — IOPAMIDOL (ISOVUE-300) INJECTION 61%
100.0000 mL | Freq: Once | INTRAVENOUS | Status: AC | PRN
  Administered 2018-06-26: 100 mL via INTRAVENOUS

## 2018-06-29 ENCOUNTER — Other Ambulatory Visit: Payer: Self-pay | Admitting: Radiation Oncology

## 2018-06-29 ENCOUNTER — Ambulatory Visit
Admission: RE | Admit: 2018-06-29 | Discharge: 2018-06-29 | Disposition: A | Discharge status: Home / Self Care | Payer: BLUE CROSS/BLUE SHIELD | Source: Ambulatory Visit | Attending: Radiation Oncology | Admitting: Radiation Oncology

## 2018-06-29 DIAGNOSIS — Z510 Encounter for antineoplastic radiation therapy: Secondary | ICD-10-CM | POA: Diagnosis not present

## 2018-06-29 DIAGNOSIS — D0511 Intraductal carcinoma in situ of right breast: Secondary | ICD-10-CM | POA: Diagnosis not present

## 2018-06-30 ENCOUNTER — Ambulatory Visit
Admission: RE | Admit: 2018-06-30 | Discharge: 2018-06-30 | Disposition: A | Discharge status: Home / Self Care | Payer: BLUE CROSS/BLUE SHIELD | Source: Ambulatory Visit | Attending: Radiation Oncology | Admitting: Radiation Oncology

## 2018-06-30 DIAGNOSIS — Z510 Encounter for antineoplastic radiation therapy: Secondary | ICD-10-CM | POA: Diagnosis not present

## 2018-06-30 DIAGNOSIS — D0511 Intraductal carcinoma in situ of right breast: Secondary | ICD-10-CM | POA: Diagnosis not present

## 2018-07-01 ENCOUNTER — Ambulatory Visit
Admission: RE | Admit: 2018-07-01 | Discharge: 2018-07-01 | Disposition: A | Discharge status: Home / Self Care | Payer: BLUE CROSS/BLUE SHIELD | Source: Ambulatory Visit | Attending: Radiation Oncology | Admitting: Radiation Oncology

## 2018-07-01 DIAGNOSIS — D0511 Intraductal carcinoma in situ of right breast: Secondary | ICD-10-CM | POA: Diagnosis not present

## 2018-07-01 DIAGNOSIS — Z510 Encounter for antineoplastic radiation therapy: Secondary | ICD-10-CM | POA: Diagnosis not present

## 2018-07-02 ENCOUNTER — Ambulatory Visit
Admission: RE | Admit: 2018-07-02 | Discharge: 2018-07-02 | Disposition: A | Discharge status: Home / Self Care | Payer: BLUE CROSS/BLUE SHIELD | Source: Ambulatory Visit | Attending: Radiation Oncology | Admitting: Radiation Oncology

## 2018-07-02 DIAGNOSIS — N201 Calculus of ureter: Secondary | ICD-10-CM | POA: Diagnosis not present

## 2018-07-02 DIAGNOSIS — D0511 Intraductal carcinoma in situ of right breast: Secondary | ICD-10-CM | POA: Diagnosis not present

## 2018-07-02 DIAGNOSIS — R3121 Asymptomatic microscopic hematuria: Secondary | ICD-10-CM | POA: Diagnosis not present

## 2018-07-02 DIAGNOSIS — N132 Hydronephrosis with renal and ureteral calculous obstruction: Secondary | ICD-10-CM | POA: Diagnosis not present

## 2018-07-02 DIAGNOSIS — Z510 Encounter for antineoplastic radiation therapy: Secondary | ICD-10-CM | POA: Diagnosis not present

## 2018-07-02 DIAGNOSIS — N130 Hydronephrosis with ureteropelvic junction obstruction: Secondary | ICD-10-CM | POA: Diagnosis not present

## 2018-07-03 ENCOUNTER — Ambulatory Visit
Admission: RE | Admit: 2018-07-03 | Discharge: 2018-07-03 | Disposition: A | Discharge status: Home / Self Care | Payer: BLUE CROSS/BLUE SHIELD | Source: Ambulatory Visit | Attending: Radiation Oncology | Admitting: Radiation Oncology

## 2018-07-03 DIAGNOSIS — Z510 Encounter for antineoplastic radiation therapy: Secondary | ICD-10-CM | POA: Diagnosis not present

## 2018-07-03 DIAGNOSIS — D0511 Intraductal carcinoma in situ of right breast: Secondary | ICD-10-CM | POA: Diagnosis not present

## 2018-07-07 ENCOUNTER — Ambulatory Visit
Admission: RE | Admit: 2018-07-07 | Discharge: 2018-07-07 | Disposition: A | Discharge status: Home / Self Care | Payer: BLUE CROSS/BLUE SHIELD | Source: Ambulatory Visit | Attending: Radiation Oncology | Admitting: Radiation Oncology

## 2018-07-07 DIAGNOSIS — Z510 Encounter for antineoplastic radiation therapy: Secondary | ICD-10-CM | POA: Insufficient documentation

## 2018-07-07 DIAGNOSIS — D0511 Intraductal carcinoma in situ of right breast: Secondary | ICD-10-CM | POA: Insufficient documentation

## 2018-07-08 ENCOUNTER — Other Ambulatory Visit (HOSPITAL_COMMUNITY): Payer: Self-pay | Admitting: Urology

## 2018-07-08 ENCOUNTER — Ambulatory Visit
Admission: RE | Admit: 2018-07-08 | Discharge: 2018-07-08 | Disposition: A | Discharge status: Home / Self Care | Payer: BLUE CROSS/BLUE SHIELD | Source: Ambulatory Visit | Attending: Radiation Oncology | Admitting: Radiation Oncology

## 2018-07-08 DIAGNOSIS — Z510 Encounter for antineoplastic radiation therapy: Secondary | ICD-10-CM | POA: Diagnosis not present

## 2018-07-08 DIAGNOSIS — D0511 Intraductal carcinoma in situ of right breast: Secondary | ICD-10-CM | POA: Diagnosis not present

## 2018-07-08 DIAGNOSIS — N1330 Unspecified hydronephrosis: Secondary | ICD-10-CM

## 2018-07-09 ENCOUNTER — Ambulatory Visit
Admission: RE | Admit: 2018-07-09 | Discharge: 2018-07-09 | Disposition: A | Discharge status: Home / Self Care | Payer: BLUE CROSS/BLUE SHIELD | Source: Ambulatory Visit | Attending: Radiation Oncology | Admitting: Radiation Oncology

## 2018-07-09 DIAGNOSIS — Z510 Encounter for antineoplastic radiation therapy: Secondary | ICD-10-CM | POA: Diagnosis not present

## 2018-07-09 DIAGNOSIS — D0511 Intraductal carcinoma in situ of right breast: Secondary | ICD-10-CM | POA: Diagnosis not present

## 2018-07-10 ENCOUNTER — Ambulatory Visit
Admission: RE | Admit: 2018-07-10 | Discharge: 2018-07-10 | Disposition: A | Discharge status: Home / Self Care | Payer: BLUE CROSS/BLUE SHIELD | Source: Ambulatory Visit | Attending: Radiation Oncology | Admitting: Radiation Oncology

## 2018-07-10 DIAGNOSIS — Z510 Encounter for antineoplastic radiation therapy: Secondary | ICD-10-CM | POA: Diagnosis not present

## 2018-07-10 DIAGNOSIS — D0511 Intraductal carcinoma in situ of right breast: Secondary | ICD-10-CM | POA: Diagnosis not present

## 2018-07-13 ENCOUNTER — Ambulatory Visit
Admission: RE | Admit: 2018-07-13 | Discharge: 2018-07-13 | Disposition: A | Discharge status: Home / Self Care | Payer: BLUE CROSS/BLUE SHIELD | Source: Ambulatory Visit | Attending: Radiation Oncology | Admitting: Radiation Oncology

## 2018-07-13 ENCOUNTER — Ambulatory Visit (HOSPITAL_COMMUNITY)
Admission: RE | Admit: 2018-07-13 | Discharge: 2018-07-13 | Disposition: A | Discharge status: Home / Self Care | Payer: BLUE CROSS/BLUE SHIELD | Source: Ambulatory Visit | Attending: Urology | Admitting: Urology

## 2018-07-13 DIAGNOSIS — N1330 Unspecified hydronephrosis: Secondary | ICD-10-CM | POA: Insufficient documentation

## 2018-07-13 DIAGNOSIS — Z510 Encounter for antineoplastic radiation therapy: Secondary | ICD-10-CM | POA: Diagnosis not present

## 2018-07-13 DIAGNOSIS — D0511 Intraductal carcinoma in situ of right breast: Secondary | ICD-10-CM | POA: Diagnosis not present

## 2018-07-13 DIAGNOSIS — N200 Calculus of kidney: Secondary | ICD-10-CM | POA: Diagnosis not present

## 2018-07-13 MED ORDER — FUROSEMIDE 10 MG/ML IJ SOLN
INTRAMUSCULAR | Status: AC
  Filled 2018-07-13: qty 4

## 2018-07-13 MED ORDER — FUROSEMIDE 10 MG/ML IJ SOLN
30.0000 mg | Freq: Once | INTRAMUSCULAR | Status: AC
  Administered 2018-07-13: 30 mg via INTRAVENOUS

## 2018-07-13 MED ORDER — TECHNETIUM TC 99M MERTIATIDE
5.0000 | Freq: Once | INTRAVENOUS | Status: AC | PRN
  Administered 2018-07-13: 5 via INTRAVENOUS

## 2018-07-14 ENCOUNTER — Ambulatory Visit
Admission: RE | Admit: 2018-07-14 | Discharge: 2018-07-14 | Disposition: A | Discharge status: Home / Self Care | Payer: BLUE CROSS/BLUE SHIELD | Source: Ambulatory Visit | Attending: Radiation Oncology | Admitting: Radiation Oncology

## 2018-07-14 DIAGNOSIS — Z510 Encounter for antineoplastic radiation therapy: Secondary | ICD-10-CM | POA: Diagnosis not present

## 2018-07-14 DIAGNOSIS — D0511 Intraductal carcinoma in situ of right breast: Secondary | ICD-10-CM | POA: Diagnosis not present

## 2018-07-15 ENCOUNTER — Ambulatory Visit
Admission: RE | Admit: 2018-07-15 | Discharge: 2018-07-15 | Disposition: A | Discharge status: Home / Self Care | Payer: BLUE CROSS/BLUE SHIELD | Source: Ambulatory Visit | Attending: Radiation Oncology | Admitting: Radiation Oncology

## 2018-07-15 DIAGNOSIS — Z510 Encounter for antineoplastic radiation therapy: Secondary | ICD-10-CM | POA: Diagnosis not present

## 2018-07-15 DIAGNOSIS — D0511 Intraductal carcinoma in situ of right breast: Secondary | ICD-10-CM | POA: Diagnosis not present

## 2018-07-16 ENCOUNTER — Inpatient Hospital Stay: Payer: BLUE CROSS/BLUE SHIELD | Attending: Hematology and Oncology | Admitting: Hematology and Oncology

## 2018-07-16 ENCOUNTER — Ambulatory Visit
Admission: RE | Admit: 2018-07-16 | Discharge: 2018-07-16 | Disposition: A | Discharge status: Home / Self Care | Payer: BLUE CROSS/BLUE SHIELD | Source: Ambulatory Visit | Attending: Radiation Oncology | Admitting: Radiation Oncology

## 2018-07-16 DIAGNOSIS — Z923 Personal history of irradiation: Secondary | ICD-10-CM | POA: Diagnosis not present

## 2018-07-16 DIAGNOSIS — Z86000 Personal history of in-situ neoplasm of breast: Secondary | ICD-10-CM | POA: Insufficient documentation

## 2018-07-16 DIAGNOSIS — D0511 Intraductal carcinoma in situ of right breast: Secondary | ICD-10-CM | POA: Diagnosis not present

## 2018-07-16 DIAGNOSIS — Z510 Encounter for antineoplastic radiation therapy: Secondary | ICD-10-CM | POA: Diagnosis not present

## 2018-07-16 MED ORDER — MONTELUKAST SODIUM 10 MG PO TABS: 10.0000 mg | ORAL_TABLET | Freq: Every day | ORAL | 2 refills | Status: AC

## 2018-07-16 NOTE — Assessment & Plan Note (Signed)
05/16/2018:Right lumpectomy: DCIS high nuclear grade with comedonecrosis, 1.6 cm, margins negative, ER 0%, PR 0%, Tis NX stage 0 Adjuvant radiation 06/23/2018 to 07/16/2018 by Dr. Isidore Moos  Breast cancer surveillance: 1.  Breast exam annually 2. mammogram: Will be scheduled for 6 months from now  There is no role of antiestrogen therapy since she is here PR negative. Patient will follow up with her surgeon and will see [place] on an as-needed basis.

## 2018-07-16 NOTE — Progress Notes (Signed)
Patient Care Team: Tisovec, Fransico Him, MD as PCP - General (Internal Medicine) Erroll Luna, MD as Consulting Physician (General Surgery) Nicholas Lose, MD as Consulting Physician (Hematology and Oncology) Eppie Gibson, MD as Attending Physician (Radiation Oncology)  DIAGNOSIS:  Encounter Diagnosis  Name Primary?  . Ductal carcinoma in situ (DCIS) of right breast     SUMMARY OF ONCOLOGIC HISTORY:   Ductal carcinoma in situ (DCIS) of right breast   03/27/2018 Initial Diagnosis    Screening detected right breast calcifications in 2 areas anteriorly 1 cm size and posteriorly 0.9 cm in size, biopsy of both of these came back as high-grade DCIS with calcifications and necrosis ER 0%, PR 0%, Tis NX stage 0    05/03/2018 Miscellaneous    Genetic testing: No mutations identified    05/20/2018 Surgery    Right lumpectomy: DCIS high nuclear grade with comedonecrosis, 1.6 cm, margins negative, ER 0%, PR 0%, Tis NX stage 0    06/23/2018 - 07/16/2018 Radiation Therapy    Adjuvant radiation therapy with Dr. Isidore Moos     CHIEF COMPLIANT: Follow-up towards end of radiation  INTERVAL HISTORY: Mallory Barnes is a 35-year with above-mentioned history of right breast DCIS treated with lumpectomy and is completing radiation.  She is here today to discuss the follow-up plan after completion of radiation therapy.  Since she is ER PR negative there is no role of antiestrogen therapy.  She has mild radiation dermatitis and appears to be doing reasonably well.  She is still walking and exercising and has very minimal fatigue.  REVIEW OF SYSTEMS:   Constitutional: Denies fevers, chills or abnormal weight loss Eyes: Denies blurriness of vision Ears, nose, mouth, throat, and face: Denies mucositis or sore throat Respiratory: Denies cough, dyspnea or wheezes Cardiovascular: Denies palpitation, chest discomfort Gastrointestinal:  Denies nausea, heartburn or change in bowel habits Skin: Denies abnormal  skin rashes Lymphatics: Denies new lymphadenopathy or easy bruising Neurological:Denies numbness, tingling or new weaknesses Behavioral/Psych: Mood is stable, no new changes  Extremities: No lower extremity edema Breast: Mild radiation dermatitis right breast All other systems were reviewed with the patient and are negative.  I have reviewed the past medical history, past surgical history, social history and family history with the patient and they are unchanged from previous note.  ALLERGIES:  is allergic to doxycycline; ketek [telithromycin]; and penicillins.  MEDICATIONS:  Current Outpatient Medications  Medication Sig Dispense Refill  . albuterol (PROVENTIL HFA;VENTOLIN HFA) 108 (90 BASE) MCG/ACT inhaler Inhale 2 puffs into the lungs. 2 puffs pre exercise prn    . BREO ELLIPTA 100-25 MCG/INH AEPB   11  . diphenhydrAMINE (BENADRYL) 25 MG tablet Take 25 mg by mouth every 6 (six) hours as needed.    Marland Kitchen GLUCOSAMINE-CHONDROITIN-MSM PO Take by mouth 2 (two) times daily.    Marland Kitchen ibuprofen (ADVIL,MOTRIN) 200 MG tablet Take 200 mg by mouth. 1 tab twice daily    . milk thistle 175 MG tablet Take 175 mg by mouth daily. Per patient, 1000 mg daily    . montelukast (SINGULAIR) 10 MG tablet   2  . MULTIPLE VITAMIN PO Take by mouth.    Marland Kitchen OVER THE COUNTER MEDICATION Blach Cohosh 540 mg daily.    Marland Kitchen OVER THE COUNTER MEDICATION Afrin pump mist only as needed for nasal congestion    . oxyCODONE (OXY IR/ROXICODONE) 5 MG immediate release tablet Take 1 tablet (5 mg total) by mouth every 6 (six) hours as needed for severe pain. (Patient  not taking: Reported on 06/02/2018) 10 tablet 0  . pantoprazole (PROTONIX) 40 MG tablet Take 40 mg by mouth daily.     No current facility-administered medications for this visit.     PHYSICAL EXAMINATION: ECOG PERFORMANCE STATUS: 1 - Symptomatic but completely ambulatory  Vitals:   07/16/18 1529  BP: 137/75  Pulse: 82  Resp: 18  Temp: 98 F (36.7 C)  SpO2: 100%    Filed Weights   07/16/18 1529  Weight: 133 lb 6.4 oz (60.5 kg)    GENERAL:alert, no distress and comfortable SKIN: skin color, texture, turgor are normal, no rashes or significant lesions EYES: normal, Conjunctiva are pink and non-injected, sclera clear OROPHARYNX:no exudate, no erythema and lips, buccal mucosa, and tongue normal  NECK: supple, thyroid normal size, non-tender, without nodularity LYMPH:  no palpable lymphadenopathy in the cervical, axillary or inguinal LUNGS: clear to auscultation and percussion with normal breathing effort HEART: regular rate & rhythm and no murmurs and no lower extremity edema ABDOMEN:abdomen soft, non-tender and normal bowel sounds MUSCULOSKELETAL:no cyanosis of digits and no clubbing  NEURO: alert & oriented x 3 with fluent speech, no focal motor/sensory deficits EXTREMITIES: No lower extremity edema   LABORATORY DATA:  I have reviewed the data as listed CMP Latest Ref Rng & Units 06/26/2018 04/08/2018  Glucose 70 - 140 mg/dL - 112  BUN 6 - 20 mg/dL 24(H) 26  Creatinine 0.44 - 1.00 mg/dL 1.10(H) 1.41(H)  Sodium 136 - 145 mmol/L - 141  Potassium 3.5 - 5.1 mmol/L - 4.2  Chloride 98 - 109 mmol/L - 104  CO2 22 - 29 mmol/L - 26  Calcium 8.4 - 10.4 mg/dL - 9.9  Total Protein 6.4 - 8.3 g/dL - 7.0  Total Bilirubin 0.2 - 1.2 mg/dL - 0.5  Alkaline Phos 40 - 150 U/L - 86  AST 5 - 34 U/L - 24  ALT 0 - 55 U/L - 17    Lab Results  Component Value Date   WBC 4.9 04/08/2018   HGB 13.3 04/08/2018   HCT 39.9 04/08/2018   MCV 89.3 04/08/2018   PLT 254 04/08/2018   NEUTROABS 3.0 04/08/2018    ASSESSMENT & PLAN:  Ductal carcinoma in situ (DCIS) of right breast 05/16/2018:Right lumpectomy: DCIS high nuclear grade with comedonecrosis, 1.6 cm, margins negative, ER 0%, PR 0%, Tis NX stage 0 Adjuvant radiation 06/23/2018 to 07/17/2018 by Dr. Isidore Moos  Breast cancer surveillance: 1.  Breast exam annually 2. mammogram: Will be scheduled for 6 months from  now  There is no role of antiestrogen therapy since she is here PR negative. Patient will follow up with her surgeon and will see [place] on an as-needed basis.    No orders of the defined types were placed in this encounter.  The patient has a good understanding of the overall plan. she agrees with it. she will call with any problems that may develop before the next visit here.   Harriette Ohara, MD 07/16/18

## 2018-07-17 ENCOUNTER — Ambulatory Visit
Admission: RE | Admit: 2018-07-17 | Discharge: 2018-07-17 | Disposition: A | Discharge status: Home / Self Care | Payer: BLUE CROSS/BLUE SHIELD | Source: Ambulatory Visit | Attending: Radiation Oncology | Admitting: Radiation Oncology

## 2018-07-17 ENCOUNTER — Other Ambulatory Visit: Payer: Self-pay | Admitting: Urology

## 2018-07-17 DIAGNOSIS — D0511 Intraductal carcinoma in situ of right breast: Secondary | ICD-10-CM | POA: Diagnosis not present

## 2018-07-17 DIAGNOSIS — Z510 Encounter for antineoplastic radiation therapy: Secondary | ICD-10-CM | POA: Diagnosis not present

## 2018-07-20 ENCOUNTER — Ambulatory Visit
Admission: RE | Admit: 2018-07-20 | Discharge: 2018-07-20 | Disposition: A | Discharge status: Home / Self Care | Payer: BLUE CROSS/BLUE SHIELD | Source: Ambulatory Visit | Attending: Radiation Oncology | Admitting: Radiation Oncology

## 2018-07-20 ENCOUNTER — Encounter: Payer: Self-pay | Admitting: *Deleted

## 2018-07-20 DIAGNOSIS — D0511 Intraductal carcinoma in situ of right breast: Secondary | ICD-10-CM | POA: Diagnosis not present

## 2018-07-20 DIAGNOSIS — Z510 Encounter for antineoplastic radiation therapy: Secondary | ICD-10-CM | POA: Diagnosis not present

## 2018-07-20 MED ORDER — RADIAPLEXRX EX GEL
Freq: Once | CUTANEOUS | Status: AC
  Administered 2018-07-20: 16:00:00 via TOPICAL

## 2018-07-24 ENCOUNTER — Encounter: Payer: Self-pay | Admitting: Radiation Oncology

## 2018-07-24 NOTE — Progress Notes (Signed)
  Radiation Oncology         [phone]) [phone] ________________________________  Name: Mallory Barnes MRN: [id number]  Date: 07/24/2018  DOB: January 01, 1961  End of Treatment Note  Diagnosis:   Stage 0 Right Breast UOQ DCIS, ER-, PR-, High Grade    Cancer Staging Ductal carcinoma in situ (DCIS) of right breast Staging form: Breast, AJCC 8th Edition - Clinical stage from 04/08/2018: Stage 0 (cTis (DCIS), cN0, cM0, ER-, PR-) - Unsigned  Indication for treatment:  Curative       Radiation treatment dates:   06/22/2018-07/20/2018  Site/dose:   1. Right breast, 2.67 Gy x 15 fractions for a total dose of 40.05 Gy           2. Boost, 2 Gy x 5 fractions for a total dose of 10 Gy   Beams/energy:   1. 3D, 6X         2. Electron, 12E  Narrative: The patient tolerated radiation treatment relatively well. At the beginning of her treatment, she was given radiaplex and instructed on its use. Towards the end of her treatment, she noted intermittent itching to her right nipple and redness/hyperpigmentation to her lateral right breast. She was using radiplex BID. On PE, it was noted erythema over right breast with skin intact.   Plan: The patient has completed radiation treatment. The patient will return to radiation oncology clinic for routine followup in one month. I advised them to call or return sooner if they have any questions or concerns related to their recovery or treatment.  -----------------------------------  Eppie Gibson, MD  This document serves as a record of services personally performed by Eppie Gibson, MD. It was created on her behalf by Steva Colder, a trained medical scribe. The creation of this record is based on the scribe's personal observations and the provider's statements to them. This document has been checked and approved by the attending provider.

## 2018-07-31 NOTE — Patient Instructions (Addendum)
Mallory Barnes  07/31/2018   Your procedure is scheduled on: 08-06-18  Report to [facility] Main  Entrance  Report to admitting at 930  AM    Call this number if you have problems the morning of surgery [phone]   Remember: Do not eat food or drink liquids :After Midnight. BRUSH YOUR TEETH MORNING OF SURGERY AND RINSE YOUR MOUTH OUT, NO CHEWING GUM CANDY OR MINTS.     Take these medicines the morning of surgery with A SIP OF WATER: ALBUTEROL INHALER IF NEEDED AND BRING INHALER, BREO ELLIPTA INHALER, PANTAPRAZOLE (PROTONIX)                               You may not have any metal on your body including hair pins and              piercings  Do not wear jewelry, make-up, lotions, powders or perfumes, deodorant             Do not wear nail polish.  Do not shave  48 hours prior to surgery.              Men may shave face and neck.   Do not bring valuables to the hospital. [place].  Contacts, dentures or bridgework may not be worn into surgery.  Leave suitcase in the car. After surgery it may be brought to your room.     Patients discharged the day of surgery will not be allowed to drive home.  Name and phone number of your driver: brother Mallory Barnes use pt cell [phone]  Special Instructions: N/A              Please read over the following fact sheets you were given: _____________________________________________________________________             [facility] - Preparing for Surgery Before surgery, you can play an important role.  Because skin is not sterile, your skin needs to be as free of germs as possible.  You can reduce the number of germs on your skin by washing with CHG (chlorahexidine gluconate) soap before surgery.  CHG is an antiseptic cleaner which kills germs and bonds with the skin to continue killing germs even after washing. Please DO NOT use if you have an allergy to CHG or antibacterial  soaps.  If your skin becomes reddened/irritated stop using the CHG and inform your nurse when you arrive at Short Stay. Do not shave (including legs and underarms) for at least 48 hours prior to the first CHG shower.  You may shave your face/neck. Please follow these instructions carefully:  1.  Shower with CHG Soap the night before surgery and the  morning of Surgery.  2.  If you choose to wash your hair, wash your hair first as usual with your  normal  shampoo.  3.  After you shampoo, rinse your hair and body thoroughly to remove the  shampoo.                           4.  Use CHG as you would any other liquid soap.  You can apply chg directly  to the skin and wash  Gently with a scrungie or clean washcloth.  5.  Apply the CHG Soap to your body ONLY FROM THE NECK DOWN.   Do not use on face/ open                           Wound or open sores. Avoid contact with eyes, ears mouth and genitals (private parts).                       Wash face,  Genitals (private parts) with your normal soap.             6.  Wash thoroughly, paying special attention to the area where your surgery  will be performed.  7.  Thoroughly rinse your body with warm water from the neck down.  8.  DO NOT shower/wash with your normal soap after using and rinsing off  the CHG Soap.                9.  Pat yourself dry with a clean towel.            10.  Wear clean pajamas.            11.  Place clean sheets on your bed the night of your first shower and do not  sleep with pets. Day of Surgery : Do not apply any lotions/deodorants the morning of surgery.  Please wear clean clothes to the hospital/surgery center.  FAILURE TO FOLLOW THESE INSTRUCTIONS MAY RESULT IN THE CANCELLATION OF YOUR SURGERY PATIENT SIGNATURE_________________________________  NURSE SIGNATURE__________________________________  ________________________________________________________________________

## 2018-08-04 ENCOUNTER — Encounter (HOSPITAL_COMMUNITY): Payer: Self-pay

## 2018-08-04 ENCOUNTER — Encounter (HOSPITAL_COMMUNITY)
Admission: RE | Admit: 2018-08-04 | Discharge: 2018-08-04 | Disposition: A | Discharge status: Home / Self Care | Payer: BLUE CROSS/BLUE SHIELD | Source: Ambulatory Visit | Attending: Urology | Admitting: Urology

## 2018-08-04 ENCOUNTER — Other Ambulatory Visit: Payer: Self-pay

## 2018-08-04 DIAGNOSIS — Z01818 Encounter for other preprocedural examination: Secondary | ICD-10-CM | POA: Diagnosis not present

## 2018-08-04 DIAGNOSIS — N201 Calculus of ureter: Secondary | ICD-10-CM | POA: Diagnosis not present

## 2018-08-04 HISTORY — DX: Personal history of urinary calculi: Z87.442

## 2018-08-04 LAB — CBC
HCT: 39.6 % (ref 36.0–46.0)
Hemoglobin: 13.2 g/dL (ref 12.0–15.0)
MCH: 30.4 pg (ref 26.0–34.0)
MCHC: 33.3 g/dL (ref 30.0–36.0)
MCV: 91.2 fL (ref 78.0–100.0)
PLATELETS: 238 10*3/uL (ref 150–400)
RBC: 4.34 MIL/uL (ref 3.87–5.11)
RDW: 12.6 % (ref 11.5–15.5)
WBC: 5.6 10*3/uL (ref 4.0–10.5)

## 2018-08-04 LAB — BASIC METABOLIC PANEL
Anion gap: 9 (ref 5–15)
BUN: 27 mg/dL — ABNORMAL HIGH (ref 6–20)
CALCIUM: 9.8 mg/dL (ref 8.9–10.3)
CO2: 26 mmol/L (ref 22–32)
Chloride: 107 mmol/L (ref 98–111)
Creatinine, Ser: 1.22 mg/dL — ABNORMAL HIGH (ref 0.44–1.00)
GFR calc Af Amer: 56 mL/min — ABNORMAL LOW (ref >60)
GFR, EST NON AFRICAN AMERICAN: 49 mL/min — AB (ref >60)
GLUCOSE: 116 mg/dL — AB (ref 70–99)
Potassium: 3.9 mmol/L (ref 3.5–5.1)
Sodium: 142 mmol/L (ref 135–145)

## 2018-08-04 NOTE — Progress Notes (Signed)
bmet results routed to dr Wynetta Emery borden by epic

## 2018-08-05 NOTE — H&P (Signed)
CC/HPI: Right hydronephrosis   Mallory Barnes is a 57 year old female seen at the request of Dr. Eppie Gibson for incidentally detected right hydronephrosis and a possible ureteral stone. She was recently diagnosed with localized breast cancer and has undergone treatment with a lumpectomy and is currently completing radiation therapy that is scheduled to finish later in September. During her planning for radiation, she was incidentally noted on imaging to potentially have a right renal mass. She proceed with a CT scan of the abdomen with and without contrast on 06/26/2018. This confirmed severe right-sided hydronephrosis with thinned parenchyma and a significantly dilated ureter down to the lower cuts on this CT scan of the abdomen. This was not brought down through the pelvis. However, on her scout image on the CT scan, there was noted to be a large 1.5 x 0.8 cm calcification possibly representing a distal right ureteral stone. She has been completely asymptomatic although does recall an episode approximately 2 years ago when she developed right-sided flank pain and was diagnosed with a urinary tract infection. She has no known history of urolithiasis. She currently denies any nausea/vomiting, fever, flank pain, or other complaints.     ALLERGIES: Doxycycline Hyclate CAPS - Nausea Penicillin - Hives    MEDICATIONS: Afrin  Albuterol Sulfate Hfa  Benadryl Allergy 25 mg tablet  Black Cohosh 540 mg capsule  Breo Ellipta 100 mcg-25 mcg/dose blister, with inhalation device  Flonase Sensimist  Glucosamine & Chondroitin  Ibuprofen 200 mg capsule  Milk Thistle  Montelukast Sodium 10 mg tablet  Multivitamin  Pantoprazole Sodium 40 mg tablet, delayed release  Vitamin D3 5,000 unit tablet     GU PSH: None   NON-GU PSH: Breast lumpectomy, Right    GU PMH: None     PMH Notes: seasonal allergies   NON-GU PMH: Asthma GERD Malignant neoplasm of unspecified site of unspecified female breast,  Right, radiation in progress lumpectomy    FAMILY HISTORY: Adult Failure To Thrive - Father Diabetes - Sister heart failure - Mother Hodgkin Disease - Sister Hypertension - Brother Pacemaker Placement - Sister Prostate Cancer - Father   SOCIAL HISTORY: Marital Status: Single Preferred Language: English; Race: White Current Smoking Status: Patient has never smoked.   Tobacco Use Assessment Completed: Used Tobacco in last 30 days? Social Drinker.  Patient's occupation Social worker.     Notes: No Children   REVIEW OF SYSTEMS:    GU Review Female:   Patient reports frequent urination, hard to postpone urination, and get up at night to urinate. Patient denies burning /pain with urination, leakage of urine, stream starts and stops, trouble starting your stream, have to strain to urinate, and currently pregnant.  Gastrointestinal (Upper):   Patient denies nausea and vomiting.  Gastrointestinal (Lower):   Patient denies diarrhea and constipation.  Constitutional:   Patient denies fever, night sweats, weight loss, and fatigue.  Skin:   Patient denies skin rash/ lesion and itching.  Eyes:   Patient denies blurred vision and double vision.  Ears/ Nose/ Throat:   Patient denies sinus problems and sore throat.  Hematologic/Lymphatic:   Patient denies swollen glands and easy bruising.  Cardiovascular:   Patient denies leg swelling and chest pains.  Respiratory:   Patient denies cough and shortness of breath.  Endocrine:   Patient denies excessive thirst.  Musculoskeletal:   Patient denies back pain and joint pain.  Neurological:   Patient denies headaches and dizziness.  Psychologic:   Patient denies depression and  anxiety.   VITAL SIGNS:      Weight 130 lb / 58.97 kg  Height 62 in / 157.48 cm  BMI 23.8 kg/m   MULTI-SYSTEM PHYSICAL EXAMINATION:    Constitutional: Well-nourished. No physical deformities. Normally developed. Good grooming.  Neck: Neck symmetrical, not  swollen. Normal tracheal position.  Respiratory: No labored breathing, no use of accessory muscles. Clear bilaterally.  Cardiovascular: Normal temperature, normal extremity pulses, no swelling, no varicosities. RRR.  Lymphatic: No enlargement of neck, axillae, groin.  Skin: No paleness, no jaundice, no cyanosis. No lesion, no ulcer, no rash.  Neurologic / Psychiatric: Oriented to time, oriented to place, oriented to person. No depression, no anxiety, no agitation.  Gastrointestinal: No mass, no tenderness, no rigidity, non obese abdomen. No CVAT.  Eyes: Normal conjunctivae. Normal eyelids.  Ears, Nose, Mouth, and Throat: Left ear no scars, no lesions, no masses. Right ear no scars, no lesions, no masses. Nose no scars, no lesions, no masses. Normal hearing. Normal lips.  Musculoskeletal: Normal gait and station of head and neck.       I independently reviewed her CT scan of the abdomen and pelvis without contrast today. This confirms right hydroureteronephrosis with a large 1.5 cm distal right ureteral stone and a large mid ureteral stone as well.    ASSESSMENT:      ICD-10 Details  1 GU:   Hydronephrosis - N13.0   2   Microscopic hematuria - R31.21   3   Ureteral calculus - N20.1    PLAN:      1. Right ureteral calculi: She has right ureteral obstruction due to two large right ureteral calculi. Her obstruction has likely been chronic based on her lack of symptoms and thinned renal parenchyma. I have recommended a nuclear medicine renal scan to assess her relative renal function. If she has preserved right renal function, I have recommended that she proceed with right ureteroscopic laser lithotripsy and right ureteral stent placement. Her stone very well may be impacted and could make ureteroscopy unsuccessful. We did review the risks, benefits, and expected recovery process associated with this procedure. I also discussed alternative options today. If she does not have preserved renal  function, she understands that there would be no absolute indication for surgical treatment as she is asymptomatic. However, she may want to consider proceeding regardless to unobstruct her kidney to avoid the risk of future infection, etc. She also has some mild urinary frequency and urgency that could possibly be exacerbated by her distal ureteral stone.    APPENDED NOTES:  I reviewed Ms. Bean's renogram. This does demonstrate decreased but preserve function of the right renal unit. I have recommended proceeding with ureteroscopy as previously discussed. All questions were answered to her stated satisfaction and she agrees to proceed and gives informed consent to proceed with ureteroscopy.

## 2018-08-06 ENCOUNTER — Other Ambulatory Visit: Payer: Self-pay

## 2018-08-06 ENCOUNTER — Ambulatory Visit (HOSPITAL_COMMUNITY): Payer: BLUE CROSS/BLUE SHIELD

## 2018-08-06 ENCOUNTER — Encounter (HOSPITAL_COMMUNITY)
Admission: RE | Disposition: A | Discharge status: Home / Self Care | Payer: Self-pay | Source: Ambulatory Visit | Attending: Urology

## 2018-08-06 ENCOUNTER — Ambulatory Visit (HOSPITAL_COMMUNITY)
Admission: RE | Admit: 2018-08-06 | Discharge: 2018-08-06 | Disposition: A | Discharge status: Home / Self Care | Payer: BLUE CROSS/BLUE SHIELD | Source: Ambulatory Visit | Attending: Urology | Admitting: Urology

## 2018-08-06 ENCOUNTER — Encounter (HOSPITAL_COMMUNITY): Payer: Self-pay | Admitting: *Deleted

## 2018-08-06 ENCOUNTER — Ambulatory Visit (HOSPITAL_COMMUNITY): Payer: BLUE CROSS/BLUE SHIELD | Admitting: Certified Registered"

## 2018-08-06 DIAGNOSIS — Z79899 Other long term (current) drug therapy: Secondary | ICD-10-CM | POA: Insufficient documentation

## 2018-08-06 DIAGNOSIS — Z881 Allergy status to other antibiotic agents status: Secondary | ICD-10-CM | POA: Diagnosis not present

## 2018-08-06 DIAGNOSIS — Z923 Personal history of irradiation: Secondary | ICD-10-CM | POA: Diagnosis not present

## 2018-08-06 DIAGNOSIS — Z853 Personal history of malignant neoplasm of breast: Secondary | ICD-10-CM | POA: Insufficient documentation

## 2018-08-06 DIAGNOSIS — Z8249 Family history of ischemic heart disease and other diseases of the circulatory system: Secondary | ICD-10-CM | POA: Diagnosis not present

## 2018-08-06 DIAGNOSIS — N3592 Unspecified urethral stricture, female: Secondary | ICD-10-CM | POA: Diagnosis not present

## 2018-08-06 DIAGNOSIS — N201 Calculus of ureter: Secondary | ICD-10-CM | POA: Diagnosis not present

## 2018-08-06 DIAGNOSIS — K219 Gastro-esophageal reflux disease without esophagitis: Secondary | ICD-10-CM | POA: Insufficient documentation

## 2018-08-06 DIAGNOSIS — N132 Hydronephrosis with renal and ureteral calculous obstruction: Secondary | ICD-10-CM | POA: Insufficient documentation

## 2018-08-06 DIAGNOSIS — Z880 Allergy status to penicillin: Secondary | ICD-10-CM | POA: Diagnosis not present

## 2018-08-06 DIAGNOSIS — N3582 Other urethral stricture, female: Secondary | ICD-10-CM | POA: Insufficient documentation

## 2018-08-06 HISTORY — DX: Personal history of irradiation: Z92.3

## 2018-08-06 HISTORY — PX: CYSTOSCOPY/URETEROSCOPY/HOLMIUM LASER/STENT PLACEMENT: SHX6546

## 2018-08-06 SURGERY — CYSTOSCOPY/URETEROSCOPY/HOLMIUM LASER/STENT PLACEMENT
Anesthesia: General | Site: Urethra | Laterality: Right

## 2018-08-06 MED ORDER — ONDANSETRON HCL 4 MG/2ML IJ SOLN
INTRAMUSCULAR | Status: DC | PRN
  Administered 2018-08-06: 4 mg via INTRAVENOUS

## 2018-08-06 MED ORDER — SODIUM CHLORIDE 0.9 % IR SOLN
Status: DC | PRN
  Administered 2018-08-06: 3000 mL via INTRAVESICAL
  Administered 2018-08-06: 3000 mL

## 2018-08-06 MED ORDER — DIPHENHYDRAMINE HCL 50 MG/ML IJ SOLN
INTRAMUSCULAR | Status: DC | PRN
  Administered 2018-08-06: 12.5 mg via INTRAVENOUS

## 2018-08-06 MED ORDER — FENTANYL CITRATE (PF) 250 MCG/5ML IJ SOLN
INTRAMUSCULAR | Status: AC
  Filled 2018-08-06: qty 5

## 2018-08-06 MED ORDER — PROMETHAZINE HCL 25 MG/ML IJ SOLN: 6.2500 mg | INTRAMUSCULAR | Status: DC | PRN

## 2018-08-06 MED ORDER — GLYCOPYRROLATE 0.2 MG/ML IJ SOLN
INTRAMUSCULAR | Status: DC | PRN
  Administered 2018-08-06: .15 mg via INTRAVENOUS

## 2018-08-06 MED ORDER — PHENYLEPHRINE 40 MCG/ML (10ML) SYRINGE FOR IV PUSH (FOR BLOOD PRESSURE SUPPORT)
PREFILLED_SYRINGE | INTRAVENOUS | Status: DC | PRN
  Administered 2018-08-06 (×3): 80 ug via INTRAVENOUS
  Administered 2018-08-06: 40 ug via INTRAVENOUS
  Administered 2018-08-06 (×4): 80 ug via INTRAVENOUS
  Administered 2018-08-06: 40 ug via INTRAVENOUS

## 2018-08-06 MED ORDER — IOHEXOL 300 MG/ML  SOLN
INTRAMUSCULAR | Status: DC | PRN
  Administered 2018-08-06: 10 mL

## 2018-08-06 MED ORDER — FENTANYL CITRATE (PF) 100 MCG/2ML IJ SOLN
25.0000 ug | INTRAMUSCULAR | Status: DC | PRN
  Administered 2018-08-06 (×2): 50 ug via INTRAVENOUS

## 2018-08-06 MED ORDER — 0.9 % SODIUM CHLORIDE (POUR BTL) OPTIME
TOPICAL | Status: DC | PRN
  Administered 2018-08-06: 1000 mL

## 2018-08-06 MED ORDER — PROPOFOL 10 MG/ML IV BOLUS
INTRAVENOUS | Status: DC | PRN
  Administered 2018-08-06: 200 mg via INTRAVENOUS

## 2018-08-06 MED ORDER — DEXAMETHASONE SODIUM PHOSPHATE 10 MG/ML IJ SOLN
INTRAMUSCULAR | Status: DC | PRN
  Administered 2018-08-06: 10 mg via INTRAVENOUS

## 2018-08-06 MED ORDER — PHENYLEPHRINE 40 MCG/ML (10ML) SYRINGE FOR IV PUSH (FOR BLOOD PRESSURE SUPPORT)
PREFILLED_SYRINGE | INTRAVENOUS | Status: AC
  Filled 2018-08-06: qty 10

## 2018-08-06 MED ORDER — KETOROLAC TROMETHAMINE 30 MG/ML IJ SOLN: 30.0000 mg | Freq: Once | INTRAMUSCULAR | Status: DC | PRN

## 2018-08-06 MED ORDER — MIDAZOLAM HCL 5 MG/5ML IJ SOLN
INTRAMUSCULAR | Status: DC | PRN
  Administered 2018-08-06: 2 mg via INTRAVENOUS

## 2018-08-06 MED ORDER — LIDOCAINE HCL (CARDIAC) PF 100 MG/5ML IV SOSY
PREFILLED_SYRINGE | INTRAVENOUS | Status: DC | PRN
  Administered 2018-08-06: 50 mg via INTRAVENOUS

## 2018-08-06 MED ORDER — LACTATED RINGERS IV SOLN
INTRAVENOUS | Status: DC
  Administered 2018-08-06: 10:00:00 via INTRAVENOUS

## 2018-08-06 MED ORDER — DIPHENHYDRAMINE HCL 50 MG/ML IJ SOLN
INTRAMUSCULAR | Status: AC
  Filled 2018-08-06: qty 1

## 2018-08-06 MED ORDER — PROPOFOL 10 MG/ML IV BOLUS
INTRAVENOUS | Status: AC
  Filled 2018-08-06: qty 20

## 2018-08-06 MED ORDER — GLYCOPYRROLATE PF 0.2 MG/ML IJ SOSY
PREFILLED_SYRINGE | INTRAMUSCULAR | Status: AC
  Filled 2018-08-06: qty 1

## 2018-08-06 MED ORDER — TRAMADOL HCL 50 MG PO TABS: 50.0000 mg | ORAL_TABLET | Freq: Four times a day (QID) | ORAL | 0 refills | Status: DC | PRN

## 2018-08-06 MED ORDER — ONDANSETRON HCL 4 MG/2ML IJ SOLN
INTRAMUSCULAR | Status: AC
  Filled 2018-08-06: qty 2

## 2018-08-06 MED ORDER — PHENAZOPYRIDINE HCL 100 MG PO TABS: 100.0000 mg | ORAL_TABLET | Freq: Three times a day (TID) | ORAL | 0 refills | Status: DC | PRN

## 2018-08-06 MED ORDER — CEFAZOLIN SODIUM-DEXTROSE 2-4 GM/100ML-% IV SOLN
2.0000 g | Freq: Once | INTRAVENOUS | Status: AC
  Administered 2018-08-06: 2 g via INTRAVENOUS
  Filled 2018-08-06: qty 100

## 2018-08-06 MED ORDER — DEXAMETHASONE SODIUM PHOSPHATE 10 MG/ML IJ SOLN
INTRAMUSCULAR | Status: AC
  Filled 2018-08-06: qty 1

## 2018-08-06 MED ORDER — MIDAZOLAM HCL 2 MG/2ML IJ SOLN
INTRAMUSCULAR | Status: AC
  Filled 2018-08-06: qty 2

## 2018-08-06 MED ORDER — FENTANYL CITRATE (PF) 100 MCG/2ML IJ SOLN
INTRAMUSCULAR | Status: DC | PRN
  Administered 2018-08-06 (×2): 25 ug via INTRAVENOUS

## 2018-08-06 MED ORDER — FENTANYL CITRATE (PF) 100 MCG/2ML IJ SOLN
INTRAMUSCULAR | Status: AC
  Filled 2018-08-06: qty 2

## 2018-08-06 SURGICAL SUPPLY — 19 items
BAG URO CATCHER STRL LF (MISCELLANEOUS) ×2 IMPLANT
BASKET ZERO TIP NITINOL 2.4FR (BASKET) IMPLANT
BSKT STON RTRVL ZERO TP 2.4FR (BASKET)
CATH INTERMIT  6FR 70CM (CATHETERS) ×1 IMPLANT
CLOTH BEACON ORANGE TIMEOUT ST (SAFETY) ×2 IMPLANT
FIBER LASER FLEXIVA 365 (UROLOGICAL SUPPLIES) ×1 IMPLANT
FIBER LASER TRAC TIP (UROLOGICAL SUPPLIES) IMPLANT
GLOVE BIOGEL M STRL SZ7.5 (GLOVE) ×2 IMPLANT
GOWN STRL REUS W/TWL LRG LVL3 (GOWN DISPOSABLE) ×4 IMPLANT
GUIDEWIRE ANG ZIPWIRE 038X150 (WIRE) IMPLANT
GUIDEWIRE STR DUAL SENSOR (WIRE) ×2 IMPLANT
IV NS 1000ML (IV SOLUTION) ×2
IV NS 1000ML BAXH (IV SOLUTION) ×1 IMPLANT
MANIFOLD NEPTUNE II (INSTRUMENTS) ×2 IMPLANT
PACK CYSTO (CUSTOM PROCEDURE TRAY) ×2 IMPLANT
SHEATH URETERAL 12FRX35CM (MISCELLANEOUS) IMPLANT
STENT URET 6FRX24 CONTOUR (STENTS) ×1 IMPLANT
TUBING CONNECTING 10 (TUBING) ×2 IMPLANT
TUBING UROLOGY SET (TUBING) ×2 IMPLANT

## 2018-08-06 NOTE — Anesthesia Postprocedure Evaluation (Signed)
Anesthesia Post Note  Patient: SHAQUILA SIGMAN  Procedure(s) Performed: CYSTOSCOPY/ RETROGRADE/URETEROSCOPY/HOLMIUM LASER/STENT PLACEMENT (Right Urethra)     Patient location during evaluation: PACU Anesthesia Type: General Level of consciousness: awake and alert Pain management: pain level controlled Vital Signs Assessment: post-procedure vital signs reviewed and stable Respiratory status: spontaneous breathing, nonlabored ventilation, respiratory function stable and patient connected to nasal cannula oxygen Cardiovascular status: blood pressure returned to baseline and stable Postop Assessment: no apparent nausea or vomiting Anesthetic complications: no    Last Vitals:  Vitals:   08/06/18 1245 08/06/18 1315  BP: 131/83 131/87  Pulse: (!) 55 70  Resp: 11 14  Temp: 36.4 C   SpO2: 100% 100%    Last Pain:  Vitals:   08/06/18 1315  TempSrc:   PainSc: 0-No pain                 Curtina Grills S

## 2018-08-06 NOTE — Transfer of Care (Signed)
Immediate Anesthesia Transfer of Care Note  Patient: Mallory Barnes  Procedure(s) Performed: CYSTOSCOPY/ RETROGRADE/URETEROSCOPY/HOLMIUM LASER/STENT PLACEMENT (Right Urethra)  Patient Location: PACU  Anesthesia Type:General  Level of Consciousness: awake, alert , oriented and patient cooperative  Airway & Oxygen Therapy: Patient Spontanous Breathing and Patient connected to face mask oxygen  Post-op Assessment: Report given to RN and Post -op Vital signs reviewed and stable  Post vital signs: Reviewed and stable  Last Vitals:  Vitals Value Taken Time  BP    Temp    Pulse    Resp    SpO2      Last Pain:  Vitals:   08/06/18 0945  TempSrc:   PainSc: 0-No pain         Complications: No apparent anesthesia complications

## 2018-08-06 NOTE — Anesthesia Preprocedure Evaluation (Signed)
Anesthesia Evaluation  Patient identified by MRN, date of birth, ID band Patient awake    Reviewed: Allergy & Precautions, NPO status , Patient's Chart, lab work & pertinent test results  Airway Mallampati: II  TM Distance: >3 FB Neck ROM: Full    Dental no notable dental hx.    Pulmonary neg pulmonary ROS,    Pulmonary exam normal breath sounds clear to auscultation       Cardiovascular negative cardio ROS Normal cardiovascular exam Rhythm:Regular Rate:Normal     Neuro/Psych negative neurological ROS  negative psych ROS   GI/Hepatic Neg liver ROS, GERD  Medicated,  Endo/Other  negative endocrine ROS  Renal/GU negative Renal ROS  negative genitourinary   Musculoskeletal negative musculoskeletal ROS (+)   Abdominal   Peds negative pediatric ROS (+)  Hematology negative hematology ROS (+)   Anesthesia Other Findings   Reproductive/Obstetrics negative OB ROS                             Anesthesia Physical Anesthesia Plan  ASA: II  Anesthesia Plan: General   Post-op Pain Management:    Induction: Intravenous  PONV Risk Score and Plan: 3 and Ondansetron, Dexamethasone, Treatment may vary due to age or medical condition and Midazolam  Airway Management Planned: LMA  Additional Equipment:   Intra-op Plan:   Post-operative Plan: Extubation in OR  Informed Consent: I have reviewed the patients History and Physical, chart, labs and discussed the procedure including the risks, benefits and alternatives for the proposed anesthesia with the patient or authorized representative who has indicated his/her understanding and acceptance.   Dental advisory given  Plan Discussed with: CRNA and Surgeon  Anesthesia Plan Comments:         Anesthesia Quick Evaluation

## 2018-08-06 NOTE — Discharge Instructions (Signed)

## 2018-08-06 NOTE — Op Note (Signed)
Preoperative diagnosis: Right ureteral calculi, urethral stenosis  Postoperative diagnosis: Right ureteral calculi, urethral stenosis  Procedure:  1. Cystoscopy 2. Right ureteroscopy and stone removal 3. Ureteroscopic laser lithotripsy 4. Right ureteral stent placement (6 x 24 with no string) 5. Right retrograde pyelography with interpretation 6. Urethral dilation  Surgeon: Pryor Curia. M.D.  Anesthesia: General  Complications: None  Intraoperative findings: Right retrograde pyelography demonstrated a filling defect within the distal right ureter consistent with the patient's known calculus without other abnormalities.  EBL: Minimal  Specimens: 1. Right ureteral calculus  Disposition of specimens: [facility] for stone analysis  Indication: Mallory Barnes is a 57 y.o. year old patient with urolithiasis. She has had a chronic asymptomatic right ureteral obstruction due to two ureteral stones including a large 1.5 cm distal stone. After reviewing the management options for treatment, the patient elected to proceed with the above surgical procedure(s). We have discussed the potential benefits and risks of the procedure, side effects of the proposed treatment, the likelihood of the patient achieving the goals of the procedure, and any potential problems that might occur during the procedure or recuperation. Informed consent has been obtained.  Description of procedure:  The patient was taken to the operating room and general anesthesia was induced.  The patient was placed in the dorsal lithotomy position, prepped and draped in the usual sterile fashion, and preoperative antibiotics were administered. A preoperative time-out was performed.   Cystourethroscopy was performed.  The patient's urethra was examined and was stenotic. The cystoscope was not able to be passed into the bladder.  I serially dilated the urethra with female sounds from 16 Fr to 28 Fr. The  bladder was then systematically examined in its entirety. There was no evidence for any bladder tumors, stones, or other mucosal pathology.    Attention then turned to the right ureteral orifice and a ureteral catheter was used to intubate the ureteral orifice.  Omnipaque contrast was injected through the ureteral catheter and a retrograde pyelogram was performed with findings as dictated above.  A 0.38 sensor guidewire was then advanced up the right ureter into the renal pelvis under fluoroscopic guidance. The 6 Fr semirigid ureteroscope was then advanced into the ureter next to the guidewire and the calculus was identified.   The distal stone was then fragmented with the 365 micron holmium laser fiber on a setting of 0.5 J and frequency of 20 Hz.  I was then able to get up to the more proximal stone and fragment it in a similar fashion.  All stones were then removed from the ureter with a zero tip nitinol basket.  Reinspection of the ureter revealed no remaining visible stones or fragments.   The wire was then backloaded through the cystoscope and a ureteral stent was advance over the wire using Seldinger technique.  The stent was positioned appropriately under fluoroscopic and cystoscopic guidance.  The wire was then removed with an adequate stent curl noted in the renal pelvis as well as in the bladder.  The bladder was then emptied and the procedure ended.  The patient appeared to tolerate the procedure well and without complications.  The patient was able to be awakened and transferred to the recovery unit in satisfactory condition.

## 2018-08-06 NOTE — Anesthesia Procedure Notes (Signed)
Procedure Name: LMA Insertion Date/Time: 08/06/2018 10:46 AM Performed by: Adalberto Ill, CRNA Pre-anesthesia Checklist: Patient identified, Emergency Drugs available, Suction available, Timeout performed and Patient being monitored Patient Re-evaluated:Patient Re-evaluated prior to induction Oxygen Delivery Method: Circle system utilized Preoxygenation: Pre-oxygenation with 100% oxygen Induction Type: IV induction Ventilation: Mask ventilation without difficulty LMA: LMA with gastric port inserted LMA Size: 4.0 Number of attempts: 1 Airway Equipment and Method: Stylet Placement Confirmation: positive ETCO2 and breath sounds checked- equal and bilateral ETT to lip (cm): yes. Tube secured with: Tape Dental Injury: Teeth and Oropharynx as per pre-operative assessment

## 2018-08-07 ENCOUNTER — Encounter (HOSPITAL_COMMUNITY): Payer: Self-pay | Admitting: Urology

## 2018-08-07 DIAGNOSIS — N201 Calculus of ureter: Secondary | ICD-10-CM | POA: Diagnosis not present

## 2018-08-20 ENCOUNTER — Encounter: Payer: Self-pay | Admitting: Radiation Oncology

## 2018-08-20 NOTE — Progress Notes (Signed)
Mallory Barnes presents for follow up of radiation completed 07/20/18 to her Right Breast. She saw Dr. Lindi Adie on 07/16/18 and will see him as needed in the future. She was ER/PR negative so will not take anti-estrogen therapy. Her follow up care will be with Dr. Brantley Stage.  Her skin has healed well. She has completed radiaplex and has been advised to use vitamin E lotion to her radiation site.   BP 110/74 (BP Location: Left Arm, Patient Position: Sitting)   Pulse 82   Temp 98.4 F (36.9 C) (Oral)   Resp 20   Ht 5\' 2"  (1.575 m)   Wt 131 lb 12.8 oz (59.8 kg)   LMP 02/06/2012   SpO2 97%   BMI 24.11 kg/m    Wt Readings from Last 3 Encounters:  08/28/18 131 lb 12.8 oz (59.8 kg)  08/04/18 131 lb (59.4 kg)  07/16/18 133 lb 6.4 oz (60.5 kg)

## 2018-08-28 ENCOUNTER — Other Ambulatory Visit: Payer: Self-pay

## 2018-08-28 ENCOUNTER — Ambulatory Visit
Admission: RE | Admit: 2018-08-28 | Discharge: 2018-08-28 | Disposition: A | Discharge status: Home / Self Care | Payer: BLUE CROSS/BLUE SHIELD | Source: Ambulatory Visit | Attending: Radiation Oncology | Admitting: Radiation Oncology

## 2018-08-28 ENCOUNTER — Encounter: Payer: Self-pay | Admitting: Radiation Oncology

## 2018-08-28 DIAGNOSIS — Z880 Allergy status to penicillin: Secondary | ICD-10-CM | POA: Insufficient documentation

## 2018-08-28 DIAGNOSIS — D0511 Intraductal carcinoma in situ of right breast: Secondary | ICD-10-CM | POA: Insufficient documentation

## 2018-08-28 DIAGNOSIS — Z881 Allergy status to other antibiotic agents status: Secondary | ICD-10-CM | POA: Insufficient documentation

## 2018-08-28 DIAGNOSIS — Z923 Personal history of irradiation: Secondary | ICD-10-CM | POA: Insufficient documentation

## 2018-08-28 DIAGNOSIS — Z79899 Other long term (current) drug therapy: Secondary | ICD-10-CM | POA: Diagnosis not present

## 2018-08-28 NOTE — Progress Notes (Signed)
Radiation Oncology         [phone] ________________________________  Name: Mallory Barnes MRN: [id number]  Date: 08/28/2018  DOB: Oct 07, 1961  Follow-Up Visit Note  Outpatient  CC: Tisovec, Fransico Him, MD  Nicholas Lose, MD  Diagnosis and Prior Radiotherapy:    ICD-10-CM   1. Ductal carcinoma in situ (DCIS) of right breast D05.11     Stage 0 Right Breast UOQ DCIS, ER-, PR-, High Grade   06/22/2018-07/20/2018: Right breast, 2.67 Gy x 15 fractions for a total dose of 40.05 Gy Boost, 2 Gy x 5 fractions for a total dose of 10 Gy     CHIEF COMPLAINT: Here for follow-up and surveillance of right breast cancer  Narrative:  The patient returns today for routine follow-up of radiation completed 07/20/18 to her right breast. She saw Dr. Lindi Adie on 07/16/18 and will see him as needed in the future. She was ER/PR negative so will not take anti-estrogen therapy. Her follow up care will be with Dr. Brantley Stage.  Since completion of radiation therapy the patient underwent cystoscopy/ureteroscopy/stent placement with Dr. Alinda Money on 08/06/2018. She is scheduled for follow up in urology in two weeks.  She is unaccompanied today. Her skin has healed well. She has completed radiaplex and been advised to use vitamin E lotion to her radiation site.                   ALLERGIES:  is allergic to doxycycline; ketek [telithromycin]; and penicillins.  Meds: Current Outpatient Medications  Medication Sig Dispense Refill  . acetaminophen (TYLENOL) 500 MG tablet Take 500-1,000 mg by mouth 2 (two) times daily as needed for moderate pain or headache.    . albuterol (PROVENTIL HFA;VENTOLIN HFA) 108 (90 BASE) MCG/ACT inhaler Inhale 2 puffs into the lungs every 6 (six) hours as needed for wheezing or shortness of breath. 2 puffs pre exercise    . Black Cohosh 540 MG CAPS Take 540 mg by mouth daily.    Marland Kitchen BREO ELLIPTA 100-25 MCG/INH AEPB Inhale 1 puff into the lungs daily.   11  . Cholecalciferol (VITAMIN D3) 5000  units CAPS Take 5,000 Units by mouth daily.    . diphenhydrAMINE (BENADRYL) 25 MG tablet Take 25-50 mg by mouth at bedtime as needed for allergies.     Marland Kitchen docusate sodium (COLACE) 100 MG capsule Take 100 mg by mouth daily as needed for mild constipation.    . fluticasone (FLONASE SENSIMIST) 27.5 MCG/SPRAY nasal spray Place 1 spray into the nose daily as needed for rhinitis or allergies.    Marland Kitchen GLUCOSAMINE-CHONDROITIN PO Take 1 tablet by mouth daily.    Marland Kitchen ibuprofen (ADVIL,MOTRIN) 200 MG tablet Take 200-400 mg by mouth every 6 (six) hours as needed for headache or moderate pain.     . Liniments (SALONPAS PAIN RELIEF PATCH EX) Apply 1 patch topically daily as needed (pain).    Marland Kitchen MILK THISTLE PLUS PO Take 1,000 mg by mouth daily.    . montelukast (SINGULAIR) 10 MG tablet Take 1 tablet (10 mg total) by mouth at bedtime. (Patient taking differently: Take 10 mg by mouth daily. )  2  . Multiple Vitamin (MULTIVITAMIN WITH MINERALS) TABS tablet Take 1 tablet by mouth daily.    . pantoprazole (PROTONIX) 40 MG tablet Take 40 mg by mouth daily.    . phenazopyridine (PYRIDIUM) 100 MG tablet Take 1 tablet (100 mg total) by mouth 3 (three) times daily as needed for pain (for burning). 20 tablet 0  .  pseudoephedrine (SUDAFED) 30 MG tablet Take 30-60 mg by mouth 3 (three) times daily as needed for congestion.    . sodium chloride (AFRIN SALINE NASAL MIST) 0.65 % nasal spray Place 1 spray into the nose as needed for congestion.    . NON FORMULARY RADIOPLEX GEL BID RIGHT ARM UNDER BREAST AND ARM    . oxymetazoline (AFRIN) 0.05 % nasal spray Place 1-2 sprays into both nostrils 2 (two) times daily as needed for congestion.     No current facility-administered medications for this encounter.     Physical Findings: The patient is in no acute distress. Patient is alert and oriented.  height is 5\' 2"  (1.575 m) and weight is 131 lb 12.8 oz (59.8 kg). Her oral temperature is 98.4 F (36.9 C). Her blood pressure is 110/74  and her pulse is 82. Her respiration is 20 and oxygen saturation is 97%. .    Satisfactory skin healing in radiotherapy fields. Faint amount of hyperpigmentation remaining over her right breast, particularly over the nipple and within the inframammary fold.    Lab Findings: Lab Results  Component Value Date   WBC 5.6 08/04/2018   HGB 13.2 08/04/2018   HCT 39.6 08/04/2018   MCV 91.2 08/04/2018   PLT 238 08/04/2018    Radiographic Findings: Dg C-arm 1-60 Min-no Report  Result Date: 08/06/2018 Fluoroscopy was utilized by the requesting physician.  No radiographic interpretation.    Impression/Plan: Healing well from radiotherapy to the breast tissue.  Continue skin care with topical Vitamin E Oil and / or lotion for at least 2 more months for further healing.  I encouraged her to continue with yearly mammography and followup with her surgeon, Dr. Brantley Stage. She will followup in medical oncology as needed. I will see her back on an as-needed basis. I have encouraged her to call if she has any issues or concerns in the future. I wished her the very best.  _____________________________________   Eppie Gibson, MD   This document serves as a record of services personally performed by Eppie Gibson, MD. It was created on her behalf by Arlyce Harman, a trained medical scribe. The creation of this record is based on the scribe's personal observations and the provider's statements to them. This document has been checked and approved by the attending provider.

## 2018-09-08 DIAGNOSIS — N201 Calculus of ureter: Secondary | ICD-10-CM | POA: Diagnosis not present

## 2018-09-14 ENCOUNTER — Encounter: Payer: Self-pay | Admitting: Licensed Clinical Social Worker

## 2018-09-14 NOTE — Progress Notes (Signed)
Update: MSH3 [id number] (p.[id number]) VUS has been reclassified to "Likely Benign." The report date is 09/08/2018.

## 2018-10-18 DIAGNOSIS — N200 Calculus of kidney: Secondary | ICD-10-CM | POA: Diagnosis not present

## 2018-10-19 DIAGNOSIS — N200 Calculus of kidney: Secondary | ICD-10-CM | POA: Diagnosis not present

## 2018-12-02 DIAGNOSIS — N201 Calculus of ureter: Secondary | ICD-10-CM | POA: Diagnosis not present

## 2018-12-09 DIAGNOSIS — N201 Calculus of ureter: Secondary | ICD-10-CM | POA: Diagnosis not present

## 2018-12-21 DIAGNOSIS — D0511 Intraductal carcinoma in situ of right breast: Secondary | ICD-10-CM | POA: Diagnosis not present

## 2019-02-26 DIAGNOSIS — Z0000 Encounter for general adult medical examination without abnormal findings: Secondary | ICD-10-CM | POA: Diagnosis not present

## 2019-02-26 DIAGNOSIS — R7301 Impaired fasting glucose: Secondary | ICD-10-CM | POA: Diagnosis not present

## 2019-03-01 DIAGNOSIS — R82998 Other abnormal findings in urine: Secondary | ICD-10-CM | POA: Diagnosis not present

## 2019-03-03 ENCOUNTER — Other Ambulatory Visit: Payer: Self-pay | Admitting: Internal Medicine

## 2019-03-03 ENCOUNTER — Other Ambulatory Visit: Payer: Self-pay | Admitting: Surgery

## 2019-03-03 DIAGNOSIS — Z853 Personal history of malignant neoplasm of breast: Secondary | ICD-10-CM

## 2019-03-05 DIAGNOSIS — D0511 Intraductal carcinoma in situ of right breast: Secondary | ICD-10-CM | POA: Diagnosis not present

## 2019-03-05 DIAGNOSIS — N390 Urinary tract infection, site not specified: Secondary | ICD-10-CM | POA: Diagnosis not present

## 2019-03-05 DIAGNOSIS — Z0000 Encounter for general adult medical examination without abnormal findings: Secondary | ICD-10-CM | POA: Diagnosis not present

## 2019-03-05 DIAGNOSIS — R7301 Impaired fasting glucose: Secondary | ICD-10-CM | POA: Diagnosis not present

## 2019-03-05 DIAGNOSIS — J42 Unspecified chronic bronchitis: Secondary | ICD-10-CM | POA: Diagnosis not present

## 2019-03-05 DIAGNOSIS — Z1331 Encounter for screening for depression: Secondary | ICD-10-CM | POA: Diagnosis not present

## 2019-03-19 ENCOUNTER — Other Ambulatory Visit: Payer: Self-pay | Admitting: Surgery

## 2019-03-19 ENCOUNTER — Ambulatory Visit
Admission: RE | Admit: 2019-03-19 | Discharge: 2019-03-19 | Disposition: A | Discharge status: Home / Self Care | Payer: BLUE CROSS/BLUE SHIELD | Source: Ambulatory Visit | Attending: Internal Medicine | Admitting: Internal Medicine

## 2019-03-19 ENCOUNTER — Other Ambulatory Visit: Payer: Self-pay

## 2019-03-19 ENCOUNTER — Ambulatory Visit
Admission: RE | Admit: 2019-03-19 | Discharge: 2019-03-19 | Disposition: A | Discharge status: Home / Self Care | Payer: BLUE CROSS/BLUE SHIELD | Source: Ambulatory Visit | Attending: Surgery | Admitting: Surgery

## 2019-03-19 DIAGNOSIS — Z853 Personal history of malignant neoplasm of breast: Secondary | ICD-10-CM

## 2019-04-26 DIAGNOSIS — Z20828 Contact with and (suspected) exposure to other viral communicable diseases: Secondary | ICD-10-CM | POA: Diagnosis not present

## 2019-08-13 DIAGNOSIS — Z23 Encounter for immunization: Secondary | ICD-10-CM | POA: Diagnosis not present

## 2019-08-27 ENCOUNTER — Other Ambulatory Visit: Payer: Self-pay

## 2019-08-27 DIAGNOSIS — Z20822 Contact with and (suspected) exposure to covid-19: Secondary | ICD-10-CM

## 2019-08-28 LAB — NOVEL CORONAVIRUS, NAA: SARS-CoV-2, NAA: NOT DETECTED

## 2019-08-29 IMAGING — CT CT ABDOMEN WO/W CM
3 of 11 series · 11 of 46 positions shown, 17 images · IV contrast (OMNIPAQUE)
Comparison: None.

CLINICAL DATA: Right renal lesion observed on radiation therapy
planning scan, for further evaluation. Right breast cancer diagnosed
June 2018 with ongoing radiation therapy.

EXAM:
CT ABDOMEN WITHOUT AND WITH CONTRAST
TECHNIQUE: Multidetector CT imaging of the abdomen was performed following the
standard protocol before and following the bolus administration of
intravenous contrast.
CONTRAST:  100mL 13DSOS-UII IOPAMIDOL (13DSOS-UII) INJECTION 61%

[Series 6: axial arterial · axial · arterial · 0.62mm/px · z∈[-384,-198]mm · 6 of 88 slices shown, 11 images]
[im 13/88  soft-tissue]
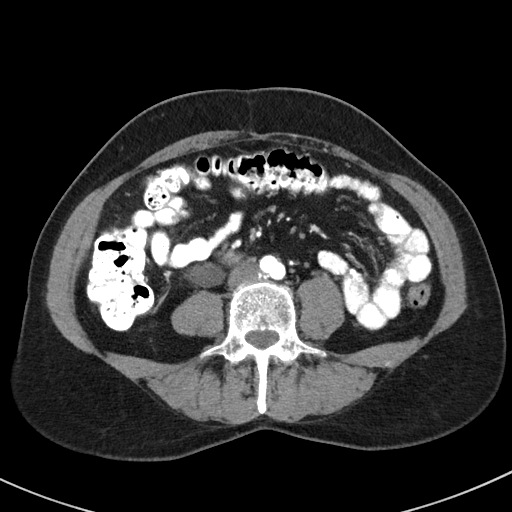
[im 13/88  bone]
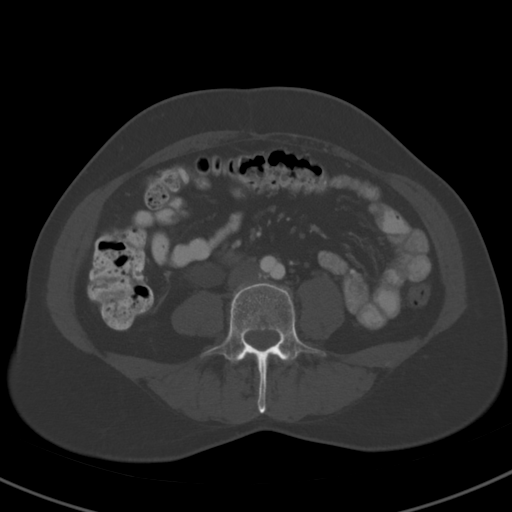
[im 25/88  soft-tissue]
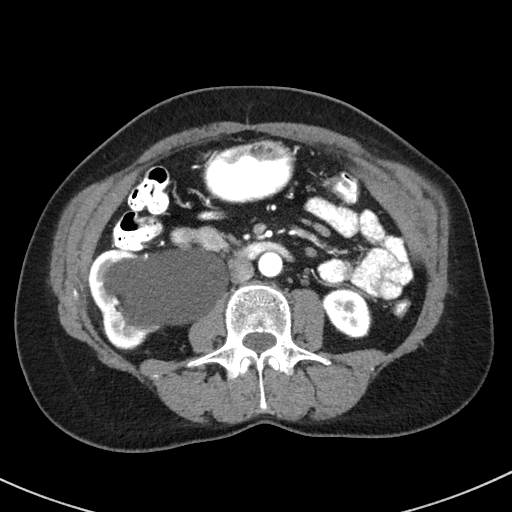
[im 38/88  soft-tissue]
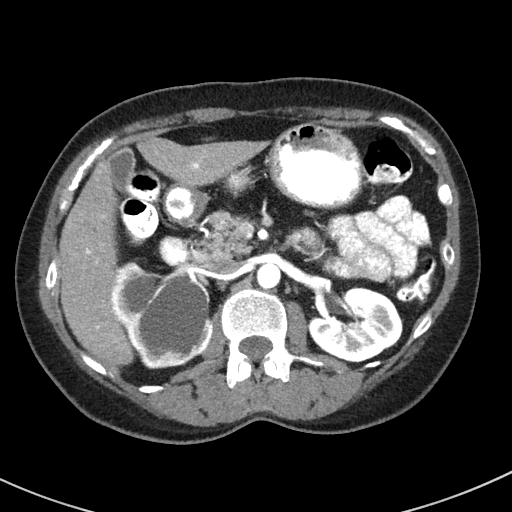
[im 38/88  lung]
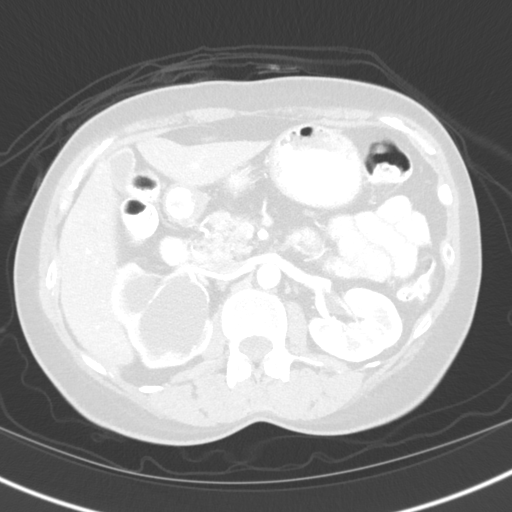
[im 50/88  soft-tissue]
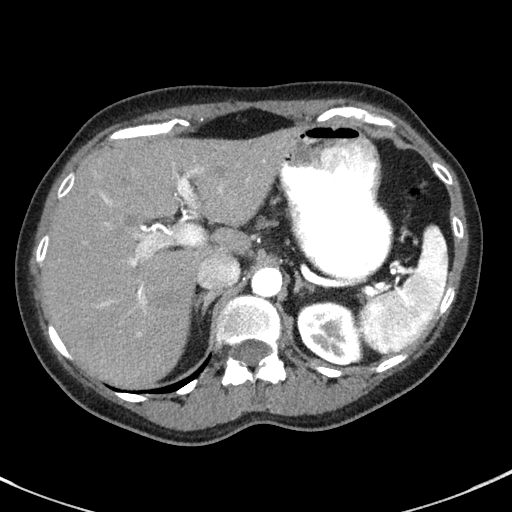
[im 50/88  lung]
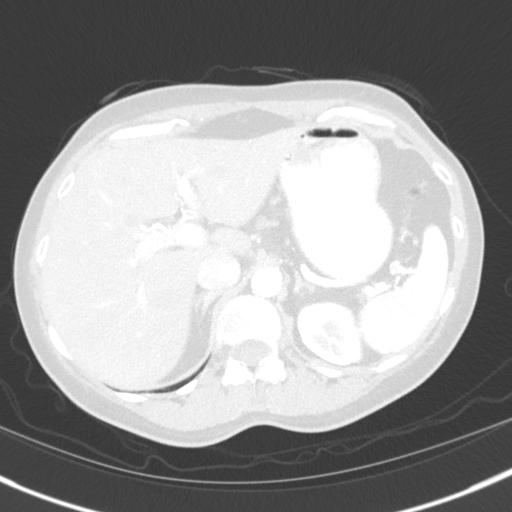
[im 63/88  soft-tissue]
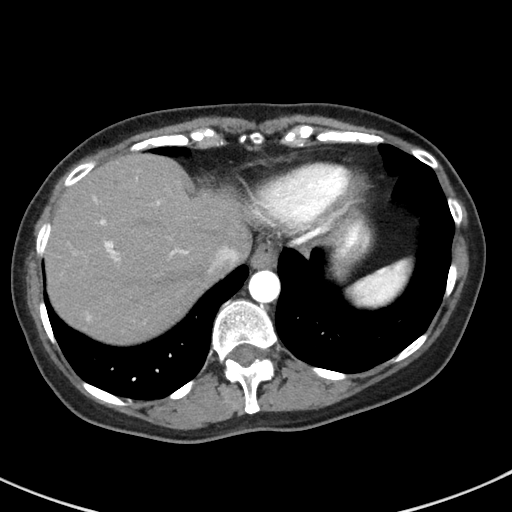
[im 63/88  lung]
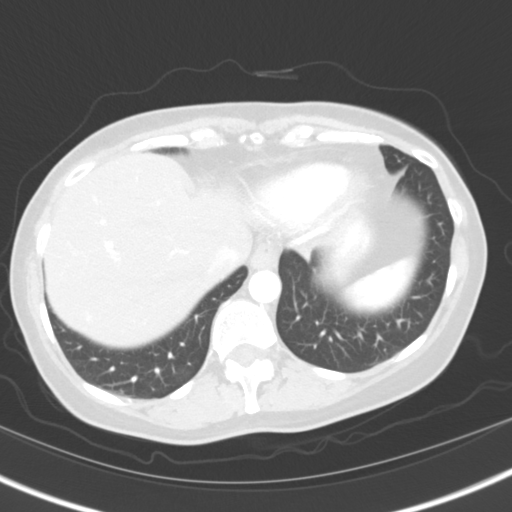
[im 75/88  soft-tissue]
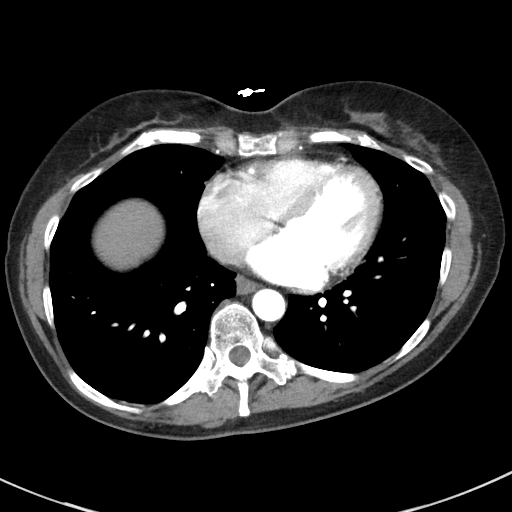
[im 75/88  lung]
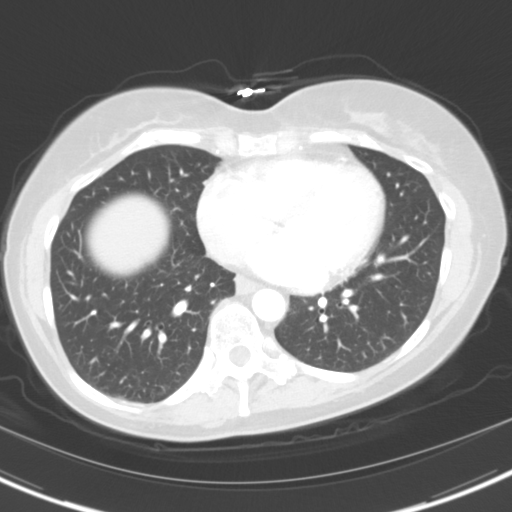

[Series 9: coronal arterial · coronal · arterial · 0.51mm/px · 2 of 101 slices shown, 3 images]
[im 34/101  soft-tissue]
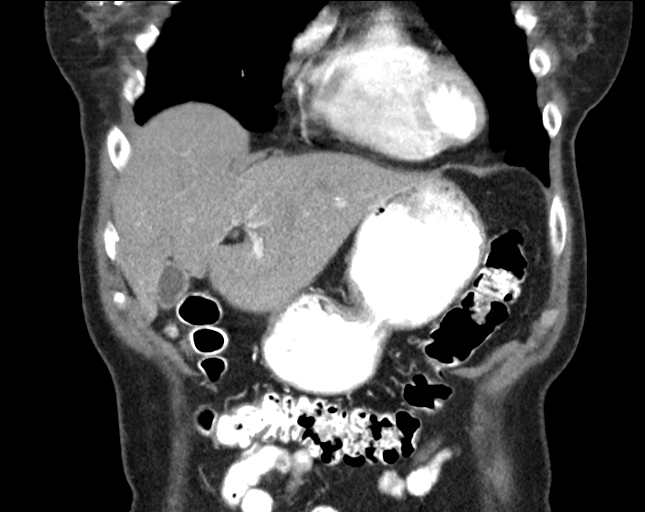
[im 34/101  bone]
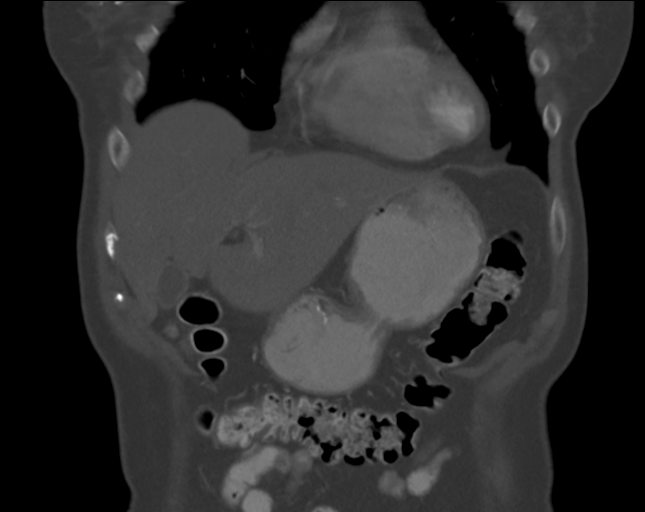
[im 67/101  soft-tissue]
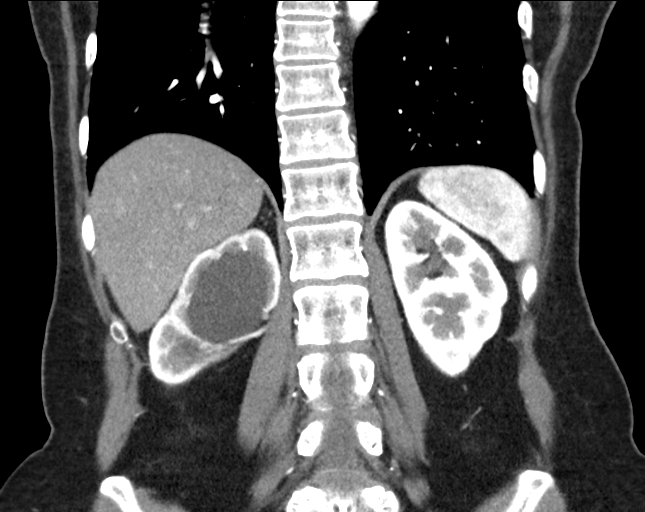

[Series 11: axial nephro · axial · 0.62mm/px · z∈[-384,-309]mm · 3 of 88 slices shown]
[im 13/88  soft-tissue]
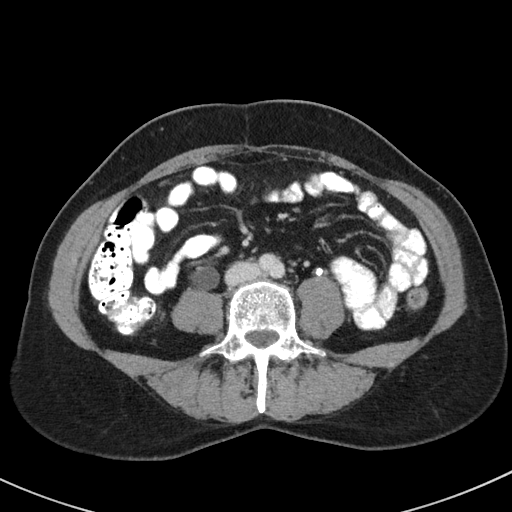
[im 25/88  soft-tissue]
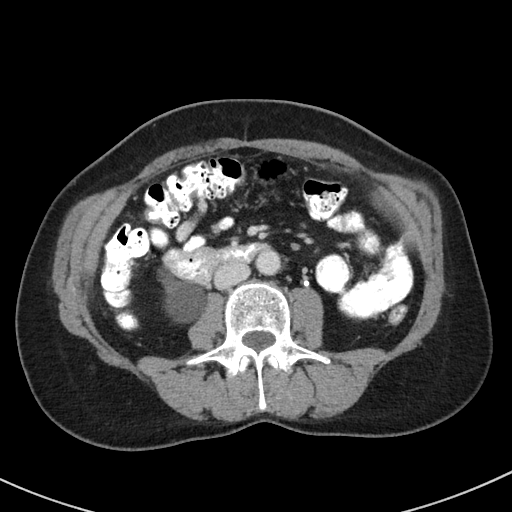
[im 38/88  soft-tissue]
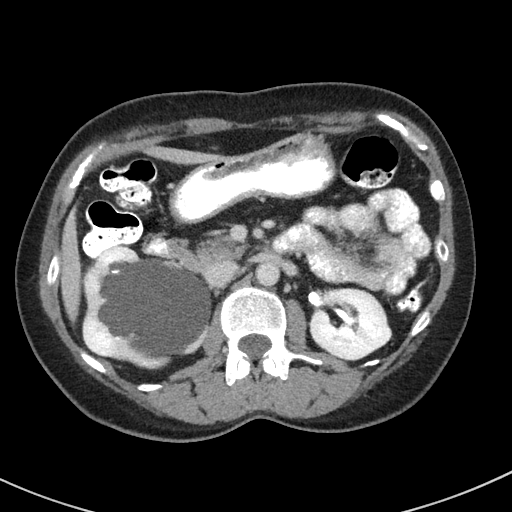

[11 of 46 positions shown; findings below may reference images not displayed]

FINDINGS: Lower chest: Unremarkable

Hepatobiliary: Mildly contracted gallbladder. Subtle 5 mm focus of
enhancement in the right hepatic lobe on image [DATE] observed only on
the arterial phase images, probably a tiny vascular malformation or
possibly a hemangioma, but technically nonspecific due to small
size.

Pancreas: Unremarkable

Spleen: Unremarkable

Adrenals/Urinary Tract: Both adrenal glands appear normal. There is
marked right hydronephrosis and hydroureter extending down into the
pelvis. Today's exam was requested as a CT abdomen only, and the
pelvis was not scanned. Delayed excretion of contrast from the right
kidney. There is some mild cortical thinning in the right kidney.

4 mm nonobstructive left kidney lower pole renal calculus.

Stomach/Bowel: Unremarkable.

Vascular/Lymphatic: No significant degree of atherosclerotic
vascular calcification. No overt pathologic adenopathy in the upper
abdomen.

Other: No supplemental non-categorized findings.

Musculoskeletal: Unremarkable
IMPRESSION: 1. The dominant finding is marked right hydronephrosis and proximal
hydroureter extending down into the pelvis. The pelvis was not
scanned on today's CT of the abdomen. There is delayed right renal
excretion indicating high-grade distal obstruction on the right
side. On the scanogram scout image, there is a 1.5 by 0.8 cm
calcification in the right hemipelvis which could conceivably be in
the distal ureter or urinary bladder. This is a possible cause for
the observed obstruction, but remains speculative. I do not see a
separate mass in the upper abdomen.
2. Tiny focus of accentuated enhancement in the right hepatic lobe
on arterial phase images, probably a tiny vascular malformation or
hemangioma, but technically nonspecific due to small size. This is
not well seen on precontrast or delayed phase images.
3. 4 mm nonobstructive left kidney lower pole renal calculus.

## 2020-02-18 ENCOUNTER — Other Ambulatory Visit: Payer: Self-pay | Admitting: Surgery

## 2020-02-18 DIAGNOSIS — Z9889 Other specified postprocedural states: Secondary | ICD-10-CM

## 2020-03-20 ENCOUNTER — Ambulatory Visit
Admission: RE | Admit: 2020-03-20 | Discharge: 2020-03-20 | Disposition: A | Discharge status: Home / Self Care | Payer: BLUE CROSS/BLUE SHIELD | Source: Ambulatory Visit | Attending: Surgery | Admitting: Surgery

## 2020-03-20 ENCOUNTER — Other Ambulatory Visit: Payer: Self-pay

## 2020-03-20 DIAGNOSIS — Z9889 Other specified postprocedural states: Secondary | ICD-10-CM

## 2021-02-20 ENCOUNTER — Other Ambulatory Visit: Payer: Self-pay | Admitting: Surgery

## 2021-02-20 DIAGNOSIS — Z9889 Other specified postprocedural states: Secondary | ICD-10-CM

## 2021-04-06 ENCOUNTER — Other Ambulatory Visit: Payer: Self-pay | Admitting: Surgery

## 2021-04-06 DIAGNOSIS — Z853 Personal history of malignant neoplasm of breast: Secondary | ICD-10-CM

## 2021-04-11 ENCOUNTER — Ambulatory Visit
Admission: RE | Admit: 2021-04-11 | Discharge: 2021-04-11 | Disposition: A | Discharge status: Home / Self Care | Payer: 59 | Source: Ambulatory Visit | Attending: Surgery | Admitting: Surgery

## 2021-04-11 ENCOUNTER — Other Ambulatory Visit: Payer: Self-pay

## 2021-04-11 DIAGNOSIS — Z853 Personal history of malignant neoplasm of breast: Secondary | ICD-10-CM

## 2022-03-14 ENCOUNTER — Other Ambulatory Visit: Payer: Self-pay | Admitting: Surgery

## 2022-03-14 DIAGNOSIS — Z9889 Other specified postprocedural states: Secondary | ICD-10-CM

## 2022-04-13 ENCOUNTER — Ambulatory Visit
Admission: RE | Admit: 2022-04-13 | Discharge: 2022-04-13 | Disposition: A | Discharge status: Home / Self Care | Payer: Commercial Managed Care - HMO | Source: Ambulatory Visit | Attending: Surgery | Admitting: Surgery

## 2022-04-13 DIAGNOSIS — Z9889 Other specified postprocedural states: Secondary | ICD-10-CM

## 2022-08-30 ENCOUNTER — Encounter: Payer: Self-pay | Admitting: Internal Medicine

## 2022-09-10 ENCOUNTER — Encounter: Payer: Self-pay | Admitting: Internal Medicine

## 2022-10-07 ENCOUNTER — Ambulatory Visit (AMBULATORY_SURGERY_CENTER): Payer: Commercial Managed Care - HMO | Admitting: *Deleted

## 2022-10-07 VITALS — Ht 62.0 in | Wt 141.0 lb

## 2022-10-07 DIAGNOSIS — Z1211 Encounter for screening for malignant neoplasm of colon: Secondary | ICD-10-CM

## 2022-10-07 MED ORDER — NA SULFATE-K SULFATE-MG SULF 17.5-3.13-1.6 GM/177ML PO SOLN: 1.0000 | Freq: Once | ORAL | 0 refills | Status: AC

## 2022-10-07 NOTE — Progress Notes (Signed)
No egg or soy allergy known to patient  No issues known to pt with past sedation with any surgeries or procedures Patient denies ever being told they had issues or difficulty with intubation  No FH of Malignant Hyperthermia Pt is not on diet pills Pt is not on  home 02  Pt is not on blood thinners  Pt denies issues with constipation  No A fib or A flutter Have any cardiac testing pending--no Pt instructed to use [employer] or GoodRx for a price reduction on prep    Patient's chart reviewed by Osvaldo Angst CNRA prior to previsit and patient appropriate for the [place].  Previsit completed and red dot placed by patient's name on their procedure day (on provider's schedule).     Pt.wanted rx sent into pharmacy on file,stated "if it's too expensive I will call back and have it sent somewhere else'

## 2022-10-22 ENCOUNTER — Encounter: Payer: Self-pay | Admitting: Internal Medicine

## 2022-10-22 ENCOUNTER — Encounter: Payer: Self-pay | Admitting: Certified Registered Nurse Anesthetist

## 2022-10-23 ENCOUNTER — Ambulatory Visit (AMBULATORY_SURGERY_CENTER): Payer: Commercial Managed Care - HMO | Admitting: Internal Medicine

## 2022-10-23 ENCOUNTER — Encounter: Payer: Self-pay | Admitting: Internal Medicine

## 2022-10-23 VITALS — BP 118/80 | HR 70 | Temp 96.8°F | Resp 24 | Ht 62.0 in | Wt 141.0 lb

## 2022-10-23 DIAGNOSIS — Z1211 Encounter for screening for malignant neoplasm of colon: Secondary | ICD-10-CM

## 2022-10-23 MED ORDER — SODIUM CHLORIDE 0.9 % IV SOLN: 500.0000 mL | Freq: Once | INTRAVENOUS | Status: DC

## 2022-10-23 NOTE — Op Note (Signed)
[place] Patient Name: Mallory Barnes Procedure Date: 10/23/2022 11:31 AM MRN: [id number] Endoscopist: Gatha Mayer , MD, [id number] Age: 61 Referring MD:  Date of Birth: [birth year] Gender: Female Account #: [id number] Procedure:                Colonoscopy Indications:              Screening for colorectal malignant neoplasm, Last                            colonoscopy: 2013 Medicines:                Monitored Anesthesia Care Procedure:                Pre-Anesthesia Assessment:                           - Prior to the procedure, a History and Physical                            was performed, and patient medications and                            allergies were reviewed. The patient's tolerance of                            previous anesthesia was also reviewed. The risks                            and benefits of the procedure and the sedation                            options and risks were discussed with the patient.                            All questions were answered, and informed consent                            was obtained. Prior Anticoagulants: The patient has                            taken no anticoagulant or antiplatelet agents. ASA                            Grade Assessment: II - A patient with mild systemic                            disease. After reviewing the risks and benefits,                            the patient was deemed in satisfactory condition to                            undergo the procedure.  After obtaining informed consent, the colonoscope                            was passed under direct vision. Throughout the                            procedure, the patient's blood pressure, pulse, and                            oxygen saturations were monitored continuously. The                            Olympus PCF-H190DL [id number]) Colonoscope was                            introduced through the anus and advanced  to the the                            cecum, identified by appendiceal orifice and                            ileocecal valve. The colonoscopy was performed                            without difficulty. The patient tolerated the                            procedure well. The quality of the bowel                            preparation was good. The bowel preparation used                            was SUPREP via split dose instruction. Scope In: 11:46:00 AM Scope Out: 12:01:29 PM Scope Withdrawal Time: 0 hours 11 minutes 46 seconds  Total Procedure Duration: 0 hours 15 minutes 29 seconds  Findings:                 The perianal and digital rectal examinations were                            normal.                           Scattered small-mouthed diverticula were found in                            the entire colon.                           The exam was otherwise without abnormality on                            direct and retroflexion views. Complications:            No immediate complications. Estimated Blood Loss:  Estimated blood loss: none. Impression:               - Diverticulosis in the entire examined colon.                           - The examination was otherwise normal on direct                            and retroflexion views.                           - No specimens collected. Recommendation:           - Patient has a contact number available for                            emergencies. The signs and symptoms of potential                            delayed complications were discussed with the                            patient. Return to normal activities tomorrow.                            Written discharge instructions were provided to the                            patient.                           - Resume previous diet.                           - Continue present medications.                           - Repeat colonoscopy in 10 years for screening                             purposes. Gatha Mayer, MD 10/23/2022 12:05:32 PM This report has been signed electronically.

## 2022-10-23 NOTE — Patient Instructions (Addendum)
No polyps or cancer were seen.  You do have diverticulosis - thickened muscle rings and pouches in the colon wall. Please read the handout about this condition.  Next routine colonoscopy or other screening test in 10 years - 2033.  Please resume your typical diet and regular medications.  I appreciate the opportunity to care for you. Gatha Mayer, MD, [facility]  Read all of the handouts given to you by your recovery room nurse.  Resume all of your medications today as ordered.  YOU HAD AN ENDOSCOPIC PROCEDURE TODAY AT [facility]:   Refer to the procedure report that was given to you for any specific questions about what was found during the examination.  If the procedure report does not answer your questions, please call your gastroenterologist to clarify.  If you requested that your care partner not be given the details of your procedure findings, then the procedure report has been included in a sealed envelope for you to review at your convenience later.  YOU SHOULD EXPECT: Some feelings of bloating in the abdomen. Passage of more gas than usual.  Walking can help get rid of the air that was put into your GI tract during the procedure and reduce the bloating. If you had a lower endoscopy (such as a colonoscopy or flexible sigmoidoscopy) you may notice spotting of blood in your stool or on the toilet paper. If you underwent a bowel prep for your procedure, you may not have a normal bowel movement for a few days.  Please Note:  You might notice some irritation and congestion in your nose or some drainage.  This is from the oxygen used during your procedure.  There is no need for concern and it should clear up in a day or so.  SYMPTOMS TO REPORT IMMEDIATELY:  Following lower endoscopy (colonoscopy or flexible sigmoidoscopy):  Excessive amounts of blood in the stool  Significant tenderness or worsening of abdominal pains  Swelling of the abdomen that is new, acute  Fever of  100F or higher    For urgent or emergent issues, a gastroenterologist can be reached at any hour by calling [phone]. Do not use MyChart messaging for urgent concerns.    DIET:  We do recommend a small meal at first, but then you may proceed to your regular diet.  Drink plenty of fluids but you should avoid alcoholic beverages for 24 hours.  ACTIVITY:  You should plan to take it easy for the rest of today and you should NOT DRIVE or use heavy machinery until tomorrow (because of the sedation medicines used during the test).    FOLLOW UP: Our staff will call the number listed on your records the next business day following your procedure.  We will call around 7:15- 8:00 am to check on you and address any questions or concerns that you may have regarding the information given to you following your procedure. If we do not reach you, we will leave a message.      SIGNATURES/CONFIDENTIALITY: You and/or your care partner have signed paperwork which will be entered into your electronic medical record.  These signatures attest to the fact that that the information above on your After Visit Summary has been reviewed and is understood.  Full responsibility of the confidentiality of this discharge information lies with you and/or your care-partner.

## 2022-10-23 NOTE — Progress Notes (Signed)
[facility] History and Physical   Primary Care Physician:  Tisovec, Fransico Him, MD   Reason for Procedure:   CRCA screening  Plan:    colonoscopy     HPI: Mallory Barnes is a 61 y.o. female for screening exam 2013 colonoscopy after iFOBT + was negative   Past Medical History:  Diagnosis Date   Allergy    Asthma    exercised induced asthma   Breast lump 01/2002   cystic, bilateral   Cancer ([place]) 05/2018   right breast cancer   Chronic bronchitis ([place])    Diverticulosis    Family history of breast cancer    Family history of cancer    Fractured toe 1990's   right 5th toe   Genetic testing 04/26/2018   Multi-Cancer panel (83 genes) @ [facility] - No pathogenic mutations detected   GERD (gastroesophageal reflux disease)    History of kidney stones    History of radiation therapy    History of radiation therapy 06/22/18- 07/20/18   Right Breast 15 fractions for a total dose of 40.05 Gy. Right breast boost 5 fractions for a total dose of 10 Gy   Seasonal allergies    Shingles 10/2006   right facial   UTI (urinary tract infection)    SEVERAL YRS AGO    Past Surgical History:  Procedure Laterality Date   BREAST LUMPECTOMY WITH RADIOACTIVE SEED LOCALIZATION Right 05/20/2018   Procedure: BREAST LUMPECTOMY WITH RADIOACTIVE SEED LOCALIZATION;  Surgeon: Erroll Luna, MD;  Location: [facility];  Service: General;  Laterality: Right;   COLONOSCOPY     X 2   CYSTOSCOPY/URETEROSCOPY/HOLMIUM LASER/STENT PLACEMENT Right 08/06/2018   Procedure: CYSTOSCOPY/ RETROGRADE/URETEROSCOPY/HOLMIUM LASER/STENT PLACEMENT;  Surgeon: Raynelle Bring, MD;  Location: [facility];  Service: Urology;  Laterality: Right;   HISTORY OF RADIATION THERAPY  06-22-18 TO 07-20-2018    Prior to Admission medications   Medication Sig Start Date End Date Taking? Authorizing Provider  acetaminophen (TYLENOL) 500 MG tablet Take 500-1,000 mg by mouth 2 (two) times daily as needed for  moderate pain or headache.   Yes [provider]  Black Cohosh 540 MG CAPS Take 700 mg by mouth daily.   Yes [provider]  BREO ELLIPTA 100-25 MCG/INH AEPB Inhale 1 puff into the lungs daily.  03/18/18  Yes [provider]  Cholecalciferol (VITAMIN D3) 5000 units CAPS Take 2,000 Units by mouth daily.   Yes [provider]  diphenhydrAMINE (BENADRYL) 25 MG tablet Take 25-50 mg by mouth at bedtime as needed for allergies.    Yes [provider]  docusate sodium (COLACE) 100 MG capsule Take 100 mg by mouth daily as needed for mild constipation.   Yes [provider]  fluticasone (FLONASE SENSIMIST) 27.5 MCG/SPRAY nasal spray Place 1 spray into the nose daily.   Yes [provider]  GLUCOSAMINE-CHONDROITIN PO Take 1,100 mg by mouth daily.   Yes [provider]  levocetirizine (XYZAL ALLERGY 24HR) 5 MG tablet Take 5 mg by mouth daily.   Yes [provider]  melatonin 5 MG TABS Take 5 mg by mouth daily.   Yes [provider]  montelukast (SINGULAIR) 10 MG tablet Take 1 tablet (10 mg total) by mouth at bedtime. Patient taking differently: Take 10 mg by mouth daily. 07/16/18  Yes Nicholas Lose, MD  Multiple Vitamin (MULTIVITAMIN WITH MINERALS) TABS tablet Take 1 tablet by mouth daily.   Yes [provider]  pantoprazole (PROTONIX) 40 MG  tablet Take 40 mg by mouth daily.   Yes [provider]  predniSONE (DELTASONE) 10 MG tablet 2 tablets Orally Once a day for 5 days 10/07/22  Yes [provider]  promethazine-dextromethorphan (PROMETHAZINE-DM) 6.25-15 MG/5ML syrup 5 mL as needed Orally every 6 hrs for cough, caution on sedation for 7 days 10/15/22  Yes [provider]  albuterol (PROVENTIL HFA;VENTOLIN HFA) 108 (90 BASE) MCG/ACT inhaler Inhale 2 puffs into the lungs every 6 (six) hours as needed for wheezing or shortness of breath. 2 puffs pre exercise    [provider]   Dextromethorphan HBr (DELSYM PO) Take by mouth as needed.    [provider]  ibuprofen (ADVIL,MOTRIN) 200 MG tablet Take 200-400 mg by mouth every 6 (six) hours as needed for headache or moderate pain.     [provider]  oxymetazoline (AFRIN) 0.05 % nasal spray Place 1-2 sprays into both nostrils 2 (two) times daily as needed for congestion.    [provider]  pseudoephedrine (SUDAFED) 30 MG tablet Take 30-60 mg by mouth 3 (three) times daily as needed for congestion.    [provider]  sodium chloride (AFRIN SALINE NASAL MIST) 0.65 % nasal spray Place 1 spray into the nose as needed for congestion. Patient not taking: Reported on 10/07/2022    [provider]    Current Outpatient Medications  Medication Sig Dispense Refill   acetaminophen (TYLENOL) 500 MG tablet Take 500-1,000 mg by mouth 2 (two) times daily as needed for moderate pain or headache.     Black Cohosh 540 MG CAPS Take 700 mg by mouth daily.     BREO ELLIPTA 100-25 MCG/INH AEPB Inhale 1 puff into the lungs daily.   11   Cholecalciferol (VITAMIN D3) 5000 units CAPS Take 2,000 Units by mouth daily.     diphenhydrAMINE (BENADRYL) 25 MG tablet Take 25-50 mg by mouth at bedtime as needed for allergies.      docusate sodium (COLACE) 100 MG capsule Take 100 mg by mouth daily as needed for mild constipation.     fluticasone (FLONASE SENSIMIST) 27.5 MCG/SPRAY nasal spray Place 1 spray into the nose daily.     GLUCOSAMINE-CHONDROITIN PO Take 1,100 mg by mouth daily.     levocetirizine (XYZAL ALLERGY 24HR) 5 MG tablet Take 5 mg by mouth daily.     melatonin 5 MG TABS Take 5 mg by mouth daily.     montelukast (SINGULAIR) 10 MG tablet Take 1 tablet (10 mg total) by mouth at bedtime. (Patient taking differently: Take 10 mg by mouth daily.)  2   Multiple Vitamin (MULTIVITAMIN WITH MINERALS) TABS tablet Take 1 tablet by mouth daily.     pantoprazole (PROTONIX) 40 MG tablet Take 40 mg by mouth  daily.     predniSONE (DELTASONE) 10 MG tablet 2 tablets Orally Once a day for 5 days     promethazine-dextromethorphan (PROMETHAZINE-DM) 6.25-15 MG/5ML syrup 5 mL as needed Orally every 6 hrs for cough, caution on sedation for 7 days     albuterol (PROVENTIL HFA;VENTOLIN HFA) 108 (90 BASE) MCG/ACT inhaler Inhale 2 puffs into the lungs every 6 (six) hours as needed for wheezing or shortness of breath. 2 puffs pre exercise     Dextromethorphan HBr (DELSYM PO) Take by mouth as needed.     ibuprofen (ADVIL,MOTRIN) 200 MG tablet Take 200-400 mg by mouth every 6 (six) hours as needed for headache or moderate pain.      oxymetazoline (AFRIN)  0.05 % nasal spray Place 1-2 sprays into both nostrils 2 (two) times daily as needed for congestion.     pseudoephedrine (SUDAFED) 30 MG tablet Take 30-60 mg by mouth 3 (three) times daily as needed for congestion.     sodium chloride (AFRIN SALINE NASAL MIST) 0.65 % nasal spray Place 1 spray into the nose as needed for congestion. (Patient not taking: Reported on 10/07/2022)     Current Facility-Administered Medications  Medication Dose Route Frequency Provider Last Rate Last Admin   0.9 %  sodium chloride infusion  500 mL Intravenous Once Gatha Mayer, MD        Allergies as of 10/23/2022 - Review Complete 10/23/2022  Allergen Reaction Noted   Doxycycline Nausea Only 05/25/2012   Doxycycline hyclate Nausea Only 10/23/2022   Ketek [telithromycin] Other (See Comments) 05/25/2012   Amoxicillin-pot clavulanate Rash 03/10/2018   Penicillin g Rash 03/10/2018   Penicillins Rash 05/25/2012    Family History  Problem Relation Age of Onset   Heart attack Mother    Pneumonia Mother    Alzheimer's disease Mother    Prostate cancer Father 38       oral cancer later   Diabetes Father    Hypertension Father    Hodgkin's lymphoma Sister    Diabetes Sister    Colon cancer Maternal Uncle        unconfirmed   Colon cancer Maternal Grandmother 14       deceased  47s   Prostate cancer Paternal Grandfather        deceased 8s   Breast cancer Other        paternal half-sister; dx 71s   Ovarian cancer Other 70       same paternal half-sister   Colon polyps Neg Hx    Crohn's disease Neg Hx    Esophageal cancer Neg Hx    Rectal cancer Neg Hx    Stomach cancer Neg Hx    Ulcerative colitis Neg Hx     Social History   Socioeconomic History   Marital status: Single    Spouse name: Not on file   Number of children: 0   Years of education: Not on file   Highest education level: Not on file  Occupational History   Occupation: pharmacy tech  Tobacco Use   Smoking status: Never    Passive exposure: Never   Smokeless tobacco: Never  Vaping Use   Vaping Use: Never used  Substance and Sexual Activity   Alcohol use: Yes    Comment: occ   Drug use: No   Sexual activity: Not on file  Other Topics Concern   Not on file  Social History Narrative   Single, no children   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, 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   Never smoker, less than one alcoholic drink daily   3-4 caffeinated beverages daily   Social Determinants of Health   Financial Resource Strain: Not on file  Food Insecurity: Not on file  Transportation Needs: No Transportation Needs (08/28/2018)   PRAPARE - Hydrologist (Medical): No    Lack of Transportation (Non-Medical): No  Physical Activity: Not on file  Stress: Not on file  Social Connections: Not on file  Intimate Partner Violence: Not At Risk (08/28/2018)   Humiliation, Afraid, Rape, and Kick questionnaire    Fear of Current or Ex-Partner: No    Emotionally Abused: No    Physically Abused: No  Sexually Abused: No    Review of Systems:  All other review of systems negative except as mentioned in the HPI.  Physical Exam: Vital signs BP (!) 126/109   Pulse 80   Temp (!) 96.8 F (36 C)   Resp 20   Ht '5\' 2"'$  (1.575 m)   Wt 141 lb (64 kg)   LMP  02/06/2012   SpO2 97%   BMI 25.79 kg/m   General:   Alert,  Well-developed, well-nourished, pleasant and cooperative in NAD Lungs:  Clear throughout to auscultation.   Heart:  Regular rate and rhythm; no murmurs, clicks, rubs,  or gallops. Abdomen:  Soft, nontender and nondistended. Normal bowel sounds.   Neuro/Psych:  Alert and cooperative. Normal mood and affect. A and O x 3   '@Laylani Pudwill'$  Simonne Maffucci, MD, [facility] [phone] (pager) 10/23/2022 11:39 AM@

## 2022-10-23 NOTE — Progress Notes (Signed)
Report given to PACU, vss 

## 2022-10-23 NOTE — Progress Notes (Signed)
Pt's states no medical or surgical changes since previsit or office visit. 

## 2022-10-24 ENCOUNTER — Telehealth: Payer: Self-pay

## 2022-10-24 NOTE — Telephone Encounter (Signed)
Attempted to reach patient for post-procedure f/u call. No answer. Left message for her to please not hesitate to call if she has any questions/concerns regarding her care. 

## 2022-10-24 NOTE — Telephone Encounter (Signed)
Patient returned call stating she was doing well.

## 2023-03-03 ENCOUNTER — Other Ambulatory Visit: Payer: Self-pay | Admitting: Internal Medicine

## 2023-03-03 DIAGNOSIS — Z1231 Encounter for screening mammogram for malignant neoplasm of breast: Secondary | ICD-10-CM

## 2023-04-15 ENCOUNTER — Ambulatory Visit
Admission: RE | Admit: 2023-04-15 | Discharge: 2023-04-15 | Disposition: A | Discharge status: Home / Self Care | Payer: Commercial Managed Care - HMO | Source: Ambulatory Visit | Attending: Internal Medicine | Admitting: Internal Medicine

## 2023-04-15 DIAGNOSIS — Z1231 Encounter for screening mammogram for malignant neoplasm of breast: Secondary | ICD-10-CM

## 2024-03-15 ENCOUNTER — Other Ambulatory Visit: Payer: Self-pay | Admitting: Internal Medicine

## 2024-03-15 DIAGNOSIS — Z1231 Encounter for screening mammogram for malignant neoplasm of breast: Secondary | ICD-10-CM

## 2024-04-16 ENCOUNTER — Ambulatory Visit
Admission: RE | Admit: 2024-04-16 | Discharge: 2024-04-16 | Disposition: A | Discharge status: Home / Self Care | Source: Ambulatory Visit | Attending: Internal Medicine | Admitting: Internal Medicine

## 2024-04-16 DIAGNOSIS — Z1231 Encounter for screening mammogram for malignant neoplasm of breast: Secondary | ICD-10-CM
